# Patient Record
Sex: Female | Born: 1980 | Race: White | Hispanic: No | Marital: Married | Smoking: Never smoker
Health system: Southern US, Community
[De-identification: ages and names within clinical notes are randomized; demographics above are authoritative.]

## PROBLEM LIST (undated history)

## (undated) ENCOUNTER — Inpatient Hospital Stay (HOSPITAL_COMMUNITY): Payer: Self-pay

## (undated) DIAGNOSIS — T8859XA Other complications of anesthesia, initial encounter: Secondary | ICD-10-CM

## (undated) DIAGNOSIS — Z8619 Personal history of other infectious and parasitic diseases: Secondary | ICD-10-CM

## (undated) DIAGNOSIS — T4145XA Adverse effect of unspecified anesthetic, initial encounter: Secondary | ICD-10-CM

## (undated) HISTORY — PX: WISDOM TOOTH EXTRACTION: SHX21

## (undated) HISTORY — DX: Personal history of other infectious and parasitic diseases: Z86.19

---

## 2013-09-08 LAB — OB RESULTS CONSOLE HIV ANTIBODY (ROUTINE TESTING): HIV: NONREACTIVE

## 2013-09-08 LAB — OB RESULTS CONSOLE ANTIBODY SCREEN: ANTIBODY SCREEN: NEGATIVE

## 2013-09-08 LAB — OB RESULTS CONSOLE ABO/RH: RH TYPE: NEGATIVE

## 2013-09-08 LAB — OB RESULTS CONSOLE RUBELLA ANTIBODY, IGM: Rubella: IMMUNE

## 2013-09-08 LAB — OB RESULTS CONSOLE GC/CHLAMYDIA
Chlamydia: NEGATIVE
GC PROBE AMP, GENITAL: NEGATIVE

## 2013-09-08 LAB — OB RESULTS CONSOLE HEPATITIS B SURFACE ANTIGEN: Hepatitis B Surface Ag: NEGATIVE

## 2013-09-08 LAB — OB RESULTS CONSOLE RPR: RPR: NONREACTIVE

## 2014-02-20 ENCOUNTER — Encounter (HOSPITAL_COMMUNITY): Payer: Self-pay | Admitting: *Deleted

## 2014-02-20 ENCOUNTER — Inpatient Hospital Stay (HOSPITAL_COMMUNITY)
Admission: AD | Admit: 2014-02-20 | Discharge: 2014-02-21 | Disposition: A | Discharge status: Home / Self Care | Payer: BC Managed Care – PPO | Source: Ambulatory Visit | Attending: Obstetrics and Gynecology | Admitting: Obstetrics and Gynecology

## 2014-02-20 DIAGNOSIS — O99891 Other specified diseases and conditions complicating pregnancy: Secondary | ICD-10-CM | POA: Insufficient documentation

## 2014-02-20 DIAGNOSIS — O9989 Other specified diseases and conditions complicating pregnancy, childbirth and the puerperium: Principal | ICD-10-CM

## 2014-02-20 DIAGNOSIS — W19XXXA Unspecified fall, initial encounter: Secondary | ICD-10-CM | POA: Insufficient documentation

## 2014-02-20 NOTE — MAU Provider Note (Signed)
Chief Complaint:  Fall   None     HPI: Renee Hudson is a 33 y.o. G1P0 at [redacted]w[redacted]d who presents about 1 hr after falling forward landing on her abdomen.  Denies contractions, leakage of fluid or vaginal bleeding. Good fetal movement.   Pregnancy Course: Rh neg, had Rhophylactic; 1 hr GCT149, 3 hr nl per pt.   Past Medical History: History reviewed. No pertinent past medical history.  Past obstetric history: OB History  Gravida Para Term Preterm AB SAB TAB Ectopic Multiple Living  1             # Outcome Date GA Lbr Len/2nd Weight Sex Delivery Anes PTL Lv  1 CUR               Past Surgical History: Past Surgical History  Procedure Laterality Date  . Wisdom tooth extraction       Family History: Family History  Problem Relation Age of Onset  . Hypertension Mother   . Hypertension Father   . Hypertension Maternal Grandmother   . Hypertension Maternal Grandfather   . Cancer Maternal Grandfather     Lung  . Hypertension Paternal Grandmother   . Hypertension Paternal Grandfather     Social History: History  Substance Use Topics  . Smoking status: Never Smoker   . Smokeless tobacco: Not on file  . Alcohol Use: Not on file    Allergies: No Known Allergies  Meds:  Prescriptions prior to admission  Medication Sig Dispense Refill  . acetaminophen (TYLENOL) 500 MG tablet Take 1,000 mg by mouth every 6 (six) hours as needed.      . loratadine-pseudoephedrine (CLARITIN-D 24-HOUR) 10-240 MG per 24 hr tablet Take 1 tablet by mouth daily.      . Pantoprazole Sodium (PROTONIX PO) Take by mouth.      . Prenatal Vit-Fe Fumarate-FA (MULTIVITAMIN-PRENATAL) 27-0.8 MG TABS tablet Take 1 tablet by mouth daily at 12 noon.        ROS: Pertinent findings in history of present illness.  Physical Exam  Blood pressure 128/68, pulse 79, temperature 98.6 F (37 C), temperature source Oral, resp. rate 18, height  (1.549 m), weight 83.915 kg (185 lb), SpO2 99.00%. GENERAL:  Well-developed, well-nourished female in no acute distress.  HEENT: normocephalic HEART: normal rate RESP: normal effort ABDOMEN: Soft, non-tender, gravid appropriate for gestational age EXTREMITIES: Nontender, no edema NEURO: alert and oriented Pelvic: NEFG, physiologic discharge, no blood    FHT:  Baseline 135 , moderate variability, accelerations present, no decelerations Contractions: none   Labs: No results found for this or any previous visit (from the past 24 hour(s)).  Imaging:  No results found. MAU Course: Will monitor for 4 hrs then discharge home  Assessment: No diagnosis found.  Plan: Discharge home after monitoring Labor precautions and fetal kick counts    Medication List    ASK your doctor about these medications       acetaminophen 500 MG tablet  Commonly known as:  TYLENOL  Take 1,000 mg by mouth every 6 (six) hours as needed.     loratadine-pseudoephedrine 10-240 MG per 24 hr tablet  Commonly known as:  CLARITIN-D 24-hour  Take 1 tablet by mouth daily.     multivitamin-prenatal 27-0.8 MG Tabs tablet  Take 1 tablet by mouth daily at 12 noon.     PROTONIX PO  Take by mouth.        Aviva Signs, CNM 02/20/2014 11:59 PM

## 2014-02-20 NOTE — MAU Provider Note (Signed)
Chief Complaint:  Fall  Pt seen by Nelva Nay, CNM

## 2014-02-20 NOTE — MAU Note (Signed)
Pt reports fall about one hour ago, walking and fell forward on her stomach. Denies bleeding, positive fetal movement since fall.

## 2014-02-21 DIAGNOSIS — O2690 Pregnancy related conditions, unspecified, unspecified trimester: Secondary | ICD-10-CM

## 2014-02-21 DIAGNOSIS — W19XXXA Unspecified fall, initial encounter: Secondary | ICD-10-CM

## 2014-02-21 DIAGNOSIS — O99891 Other specified diseases and conditions complicating pregnancy: Secondary | ICD-10-CM | POA: Diagnosis present

## 2014-02-21 DIAGNOSIS — S3981XA Other specified injuries of abdomen, initial encounter: Secondary | ICD-10-CM

## 2014-02-21 NOTE — Discharge Instructions (Signed)

## 2014-03-20 LAB — OB RESULTS CONSOLE GBS: STREP GROUP B AG: POSITIVE

## 2014-04-13 ENCOUNTER — Encounter (HOSPITAL_COMMUNITY): Payer: Self-pay | Admitting: *Deleted

## 2014-04-13 ENCOUNTER — Telehealth (HOSPITAL_COMMUNITY): Payer: Self-pay | Admitting: *Deleted

## 2014-04-13 NOTE — Telephone Encounter (Signed)
Preadmission screen  

## 2014-04-20 ENCOUNTER — Inpatient Hospital Stay (HOSPITAL_COMMUNITY)
Admission: RE | Admit: 2014-04-20 | Discharge: 2014-04-20 | Disposition: A | Discharge status: Home / Self Care | Payer: BC Managed Care – PPO | Source: Ambulatory Visit | Attending: Obstetrics & Gynecology | Admitting: Obstetrics & Gynecology

## 2014-04-20 ENCOUNTER — Encounter (HOSPITAL_COMMUNITY): Payer: Self-pay | Admitting: *Deleted

## 2014-04-20 ENCOUNTER — Inpatient Hospital Stay (HOSPITAL_COMMUNITY)
Admission: AD | Admit: 2014-04-20 | Discharge: 2014-04-24 | DRG: 765 | Disposition: A | Discharge status: Home / Self Care | Payer: BC Managed Care – PPO | Source: Ambulatory Visit | Attending: Obstetrics & Gynecology | Admitting: Obstetrics & Gynecology

## 2014-04-20 DIAGNOSIS — K219 Gastro-esophageal reflux disease without esophagitis: Secondary | ICD-10-CM | POA: Diagnosis present

## 2014-04-20 DIAGNOSIS — Z8249 Family history of ischemic heart disease and other diseases of the circulatory system: Secondary | ICD-10-CM

## 2014-04-20 DIAGNOSIS — Z833 Family history of diabetes mellitus: Secondary | ICD-10-CM | POA: Diagnosis not present

## 2014-04-20 DIAGNOSIS — O360930 Maternal care for other rhesus isoimmunization, third trimester, not applicable or unspecified: Secondary | ICD-10-CM | POA: Diagnosis present

## 2014-04-20 DIAGNOSIS — Z3A41 41 weeks gestation of pregnancy: Secondary | ICD-10-CM | POA: Diagnosis present

## 2014-04-20 DIAGNOSIS — O9912 Other diseases of the blood and blood-forming organs and certain disorders involving the immune mechanism complicating childbirth: Secondary | ICD-10-CM | POA: Diagnosis present

## 2014-04-20 DIAGNOSIS — Z3490 Encounter for supervision of normal pregnancy, unspecified, unspecified trimester: Secondary | ICD-10-CM

## 2014-04-20 DIAGNOSIS — O99824 Streptococcus B carrier state complicating childbirth: Secondary | ICD-10-CM | POA: Diagnosis present

## 2014-04-20 DIAGNOSIS — D696 Thrombocytopenia, unspecified: Secondary | ICD-10-CM | POA: Diagnosis present

## 2014-04-20 DIAGNOSIS — O480 Post-term pregnancy: Principal | ICD-10-CM | POA: Diagnosis present

## 2014-04-20 DIAGNOSIS — Z98891 History of uterine scar from previous surgery: Secondary | ICD-10-CM

## 2014-04-20 DIAGNOSIS — O99613 Diseases of the digestive system complicating pregnancy, third trimester: Secondary | ICD-10-CM | POA: Diagnosis present

## 2014-04-20 LAB — CBC
HCT: 34.8 % — ABNORMAL LOW (ref 36.0–46.0)
HEMOGLOBIN: 12.3 g/dL (ref 12.0–15.0)
MCH: 32.2 pg (ref 26.0–34.0)
MCHC: 35.3 g/dL (ref 30.0–36.0)
MCV: 91.1 fL (ref 78.0–100.0)
Platelets: 142 10*3/uL — ABNORMAL LOW (ref 150–400)
RBC: 3.82 MIL/uL — AB (ref 3.87–5.11)
RDW: 13.2 % (ref 11.5–15.5)
WBC: 11.6 10*3/uL — AB (ref 4.0–10.5)

## 2014-04-20 LAB — TYPE AND SCREEN
ABO/RH(D): B NEG
ANTIBODY SCREEN: NEGATIVE

## 2014-04-20 MED ORDER — LIDOCAINE HCL (PF) 1 % IJ SOLN: 30.0000 mL | INTRAMUSCULAR | Status: DC | PRN

## 2014-04-20 MED ORDER — OXYCODONE-ACETAMINOPHEN 5-325 MG PO TABS: 2.0000 | ORAL_TABLET | ORAL | Status: DC | PRN

## 2014-04-20 MED ORDER — CITRIC ACID-SODIUM CITRATE 334-500 MG/5ML PO SOLN
30.0000 mL | ORAL | Status: DC | PRN
  Administered 2014-04-22: 30 mL via ORAL
  Filled 2014-04-20: qty 15

## 2014-04-20 MED ORDER — OXYTOCIN 40 UNITS IN LACTATED RINGERS INFUSION - SIMPLE MED: 62.5000 mL/h | INTRAVENOUS | Status: DC

## 2014-04-20 MED ORDER — LACTATED RINGERS IV SOLN: 500.0000 mL | INTRAVENOUS | Status: DC | PRN

## 2014-04-20 MED ORDER — ZOLPIDEM TARTRATE 5 MG PO TABS
5.0000 mg | ORAL_TABLET | Freq: Every evening | ORAL | Status: DC | PRN
  Administered 2014-04-20: 5 mg via ORAL
  Filled 2014-04-20: qty 1

## 2014-04-20 MED ORDER — FLEET ENEMA 7-19 GM/118ML RE ENEM: 1.0000 | ENEMA | RECTAL | Status: DC | PRN

## 2014-04-20 MED ORDER — TERBUTALINE SULFATE 1 MG/ML IJ SOLN: 0.2500 mg | Freq: Once | INTRAMUSCULAR | Status: AC | PRN

## 2014-04-20 MED ORDER — BUTORPHANOL TARTRATE 1 MG/ML IJ SOLN
1.0000 mg | INTRAMUSCULAR | Status: DC | PRN
  Administered 2014-04-21: 1 mg via INTRAVENOUS
  Filled 2014-04-20: qty 1

## 2014-04-20 MED ORDER — ACETAMINOPHEN 325 MG PO TABS: 650.0000 mg | ORAL_TABLET | ORAL | Status: DC | PRN

## 2014-04-20 MED ORDER — LACTATED RINGERS IV SOLN
INTRAVENOUS | Status: DC
  Administered 2014-04-20 – 2014-04-21 (×2): via INTRAVENOUS

## 2014-04-20 MED ORDER — OXYTOCIN BOLUS FROM INFUSION: 500.0000 mL | INTRAVENOUS | Status: DC

## 2014-04-20 MED ORDER — MISOPROSTOL 25 MCG QUARTER TABLET
25.0000 ug | ORAL_TABLET | ORAL | Status: DC | PRN
  Administered 2014-04-20 – 2014-04-21 (×2): 25 ug via VAGINAL
  Filled 2014-04-20 (×2): qty 0.25

## 2014-04-20 MED ORDER — PENICILLIN G POTASSIUM 5000000 UNITS IJ SOLR
5.0000 10*6.[IU] | Freq: Once | INTRAVENOUS | Status: DC
  Filled 2014-04-20: qty 5

## 2014-04-20 MED ORDER — PENICILLIN G POTASSIUM 5000000 UNITS IJ SOLR
2.5000 10*6.[IU] | INTRAVENOUS | Status: DC
  Filled 2014-04-20 (×5): qty 2.5

## 2014-04-20 MED ORDER — ONDANSETRON HCL 4 MG/2ML IJ SOLN
4.0000 mg | Freq: Four times a day (QID) | INTRAMUSCULAR | Status: DC | PRN
  Administered 2014-04-21: 4 mg via INTRAVENOUS
  Filled 2014-04-20: qty 2

## 2014-04-20 MED ORDER — OXYCODONE-ACETAMINOPHEN 5-325 MG PO TABS: 1.0000 | ORAL_TABLET | ORAL | Status: DC | PRN

## 2014-04-21 ENCOUNTER — Encounter (HOSPITAL_COMMUNITY): Payer: BC Managed Care – PPO | Admitting: Anesthesiology

## 2014-04-21 ENCOUNTER — Encounter (HOSPITAL_COMMUNITY): Payer: Self-pay | Admitting: Anesthesiology

## 2014-04-21 ENCOUNTER — Inpatient Hospital Stay (HOSPITAL_COMMUNITY): Payer: BC Managed Care – PPO | Admitting: Anesthesiology

## 2014-04-21 LAB — ABO/RH: ABO/RH(D): B NEG

## 2014-04-21 LAB — RPR

## 2014-04-21 MED ORDER — OXYTOCIN 40 UNITS IN LACTATED RINGERS INFUSION - SIMPLE MED
1.0000 m[IU]/min | INTRAVENOUS | Status: DC
  Administered 2014-04-21: 2 m[IU]/min via INTRAVENOUS
  Filled 2014-04-21: qty 1000

## 2014-04-21 MED ORDER — KETOROLAC TROMETHAMINE 30 MG/ML IJ SOLN: 30.0000 mg | Freq: Four times a day (QID) | INTRAMUSCULAR | Status: DC | PRN

## 2014-04-21 MED ORDER — EPHEDRINE 5 MG/ML INJ
10.0000 mg | INTRAVENOUS | Status: DC | PRN
  Filled 2014-04-21: qty 2

## 2014-04-21 MED ORDER — PHENYLEPHRINE 40 MCG/ML (10ML) SYRINGE FOR IV PUSH (FOR BLOOD PRESSURE SUPPORT)
80.0000 ug | PREFILLED_SYRINGE | INTRAVENOUS | Status: DC | PRN
  Filled 2014-04-21: qty 2

## 2014-04-21 MED ORDER — FENTANYL 2.5 MCG/ML BUPIVACAINE 1/10 % EPIDURAL INFUSION (WH - ANES)
INTRAMUSCULAR | Status: DC | PRN
  Administered 2014-04-21: 12.5 mL/h via EPIDURAL

## 2014-04-21 MED ORDER — CEFAZOLIN SODIUM-DEXTROSE 2-3 GM-% IV SOLR
2.0000 g | Freq: Three times a day (TID) | INTRAVENOUS | Status: DC
  Administered 2014-04-22 (×2): 2 g via INTRAVENOUS
  Filled 2014-04-21 (×3): qty 50

## 2014-04-21 MED ORDER — TERBUTALINE SULFATE 1 MG/ML IJ SOLN: 0.2500 mg | Freq: Once | INTRAMUSCULAR | Status: AC | PRN

## 2014-04-21 MED ORDER — PENICILLIN G POTASSIUM 5000000 UNITS IJ SOLR
2.5000 10*6.[IU] | INTRAVENOUS | Status: DC
  Administered 2014-04-21 (×3): 2.5 10*6.[IU] via INTRAVENOUS
  Filled 2014-04-21 (×9): qty 2.5

## 2014-04-21 MED ORDER — DIPHENHYDRAMINE HCL 50 MG/ML IJ SOLN: 12.5000 mg | INTRAMUSCULAR | Status: DC | PRN

## 2014-04-21 MED ORDER — FENTANYL 2.5 MCG/ML BUPIVACAINE 1/10 % EPIDURAL INFUSION (WH - ANES)
14.0000 mL/h | INTRAMUSCULAR | Status: DC | PRN
  Administered 2014-04-21 (×2): 14 mL/h via EPIDURAL
  Filled 2014-04-21 (×2): qty 125

## 2014-04-21 MED ORDER — LIDOCAINE HCL (PF) 1 % IJ SOLN
INTRAMUSCULAR | Status: DC | PRN
  Administered 2014-04-21: 3 mL
  Administered 2014-04-21: 4 mL

## 2014-04-21 MED ORDER — DEXTROSE 5 % IV SOLN
5.0000 10*6.[IU] | Freq: Once | INTRAVENOUS | Status: AC
  Administered 2014-04-21: 5 10*6.[IU] via INTRAVENOUS
  Filled 2014-04-21: qty 5

## 2014-04-21 MED ORDER — KETOROLAC TROMETHAMINE 30 MG/ML IJ SOLN
30.0000 mg | Freq: Four times a day (QID) | INTRAMUSCULAR | Status: DC | PRN
  Administered 2014-04-22: 30 mg via INTRAVENOUS

## 2014-04-21 MED ORDER — PHENYLEPHRINE 40 MCG/ML (10ML) SYRINGE FOR IV PUSH (FOR BLOOD PRESSURE SUPPORT)
80.0000 ug | PREFILLED_SYRINGE | INTRAVENOUS | Status: DC | PRN
  Filled 2014-04-21 (×2): qty 10
  Filled 2014-04-21: qty 2

## 2014-04-21 MED ORDER — LACTATED RINGERS IV SOLN
500.0000 mL | Freq: Once | INTRAVENOUS | Status: AC
  Administered 2014-04-21: 500 mL via INTRAVENOUS

## 2014-04-21 NOTE — Anesthesia Procedure Notes (Signed)
Epidural Patient location during procedure: OB Start time: 04/21/2014 11:41 AM  Staffing Anesthesiologist: Ionna Avis A. Performed by: anesthesiologist   Preanesthetic Checklist Completed: patient identified, site marked, surgical consent, pre-op evaluation, timeout performed, IV checked, risks and benefits discussed and monitors and equipment checked  Epidural Patient position: sitting Prep: site prepped and draped and DuraPrep Patient monitoring: continuous pulse ox and blood pressure Approach: midline Location: L3-L4 Injection technique: LOR air  Needle:  Needle type: Tuohy  Needle gauge: 17 G Needle length: 9 cm and 9 Needle insertion depth: 5 cm cm Catheter type: closed end flexible Catheter size: 19 Gauge Catheter at skin depth: 10 cm Test dose: negative and Other  Assessment Events: blood not aspirated, injection not painful, no injection resistance, negative IV test and no paresthesia  Additional Notes Patient identified. Risks and benefits discussed including failed block, incomplete  Pain control, post dural puncture headache, nerve damage, paralysis, blood pressure Changes, nausea, vomiting, reactions to medications-both toxic and allergic and post Partum back pain. All questions were answered. Patient expressed understanding and wished to proceed. Sterile technique was used throughout procedure. Epidural site was Dressed with sterile barrier dressing. No paresthesias, signs of intravascular injection Or signs of intrathecal spread were encountered.  Patient was more comfortable after the epidural was dosed. Please see RN's note for documentation of vital signs and FHR which are stable.

## 2014-04-21 NOTE — H&P (Signed)
Renee Hudson is a 33 y.o. female presenting for postdates induction.  Antepartum course complicated by Rh negative and GBS positive.  She received 2 doses of cytotec overnight and reports mild CTX.     Maternal Medical History:  Fetal activity: Perceived fetal activity is normal.   Last perceived fetal movement was within the past hour.    Prenatal complications: no prenatal complications Prenatal Complications - Diabetes: none.    OB History   Grav Para Term Preterm Abortions TAB SAB Ect Mult Living   1              Past Medical History  Diagnosis Date  . Hx of varicella    Past Surgical History  Procedure Laterality Date  . Wisdom tooth extraction     Family History: family history includes Cancer in her maternal grandfather; Diabetes in her paternal uncle; Hypertension in her father, maternal grandfather, maternal grandmother, mother, paternal grandfather, and paternal grandmother; Multiple sclerosis in her maternal grandfather; Thyroid disease in her mother. Social History:  reports that she has never smoked. She does not have any smokeless tobacco history on file. She reports that she does not drink alcohol or use illicit drugs.   Prenatal Transfer Tool  Maternal Diabetes: No Genetic Screening: Normal Maternal Ultrasounds/Referrals: Normal Fetal Ultrasounds or other Referrals:  None Maternal Substance Abuse:  No Significant Maternal Medications:  None Significant Maternal Lab Results:  Lab values include: Group B Strep positive Other Comments:  None  ROS  Dilation: 3 Effacement (%): 50 Station: -2 Exam by:: Dr Langston MaskerMorris Blood pressure 125/73, pulse 66, temperature 98.7 F (37.1 C), temperature source Oral, resp. rate 20, height 5\' 1"  (1.549 m), weight 190 lb (86.183 kg). Maternal Exam:  Uterine Assessment: Contraction strength is mild.  Contraction frequency is irregular.   Abdomen: Patient reports no abdominal tenderness. Fundal height is c/w dates.   Estimated  fetal weight is 8#.   Fetal presentation: vertex  Introitus: Normal vulva. Pelvis: adequate for delivery.   Cervix: Cervix evaluated by digital exam.     Physical Exam  Constitutional: She is oriented to person, place, and time. She appears well-developed and well-nourished.  GI: Soft. There is no rebound and no guarding.  Neurological: She is alert and oriented to person, place, and time.  Skin: Skin is warm and dry.  Psychiatric: She has a normal mood and affect. Her behavior is normal.    Prenatal labs: ABO, Rh: --/--/B NEG, B NEG (10/22 2115) Antibody: NEG (10/22 2115) Rubella: Immune (03/12 0000) RPR: NON REAC (10/22 2115)  HBsAg: Negative (03/12 0000)  HIV: Non-reactive (03/12 0000)  GBS: Positive (09/21 0000)   Assessment/Plan: 33yo G1 at 7576w0d with PDI -s/p AROM -Will start pitocin -Epidural when desired   Jacquese Cassarino 04/21/2014, 7:57 AM

## 2014-04-21 NOTE — Plan of Care (Signed)
Problem: Consults Goal: Birthing Suites Patient Information Press F2 to bring up selections list Outcome: Completed/Met Date Met:  04/21/14  Pt > [redacted] weeks EGA and Inpatient induction

## 2014-04-21 NOTE — Anesthesia Preprocedure Evaluation (Signed)
Anesthesia Evaluation  Patient identified by MRN, date of birth, ID band Patient awake    Reviewed: Allergy & Precautions, H&P , Patient's Chart, lab work & pertinent test results  Airway Mallampati: III TM Distance: >3 FB Neck ROM: Full    Dental no notable dental hx. (+) Teeth Intact   Pulmonary neg pulmonary ROS,  breath sounds clear to auscultation  Pulmonary exam normal       Cardiovascular negative cardio ROS  Rhythm:Regular Rate:Normal     Neuro/Psych negative neurological ROS  negative psych ROS   GI/Hepatic Neg liver ROS, GERD-  Medicated and Controlled,  Endo/Other  Obesity  Renal/GU negative Renal ROS  negative genitourinary   Musculoskeletal negative musculoskeletal ROS (+)   Abdominal (+) + obese,   Peds  Hematology  (+) anemia , Thrombocytopenia-mild    Anesthesia Other Findings   Reproductive/Obstetrics (+) Pregnancy                           Anesthesia Physical Anesthesia Plan  ASA: II  Anesthesia Plan: Epidural   Post-op Pain Management:    Induction:   Airway Management Planned: Natural Airway  Additional Equipment:   Intra-op Plan:   Post-operative Plan:   Informed Consent: I have reviewed the patients History and Physical, chart, labs and discussed the procedure including the risks, benefits and alternatives for the proposed anesthesia with the patient or authorized representative who has indicated his/her understanding and acceptance.     Plan Discussed with: Anesthesiologist  Anesthesia Plan Comments:         Anesthesia Quick Evaluation

## 2014-04-21 NOTE — Progress Notes (Signed)
Patient has no cervical change (9/c/-1) x 4 hours despite repositioning and pitocin.  Cat I FHT.  Patient counseled re: recommendation for C/S secondary to arrest of descent.  She is informed of the risk of bleeding, infection, scarring and damage to surrounding structures.  She understands the effects in future pregnancies.  All questions were answered and the patient wishes to proceed.    Mitchel HonourMegan Tzipora Mcinroy, DO

## 2014-04-22 ENCOUNTER — Encounter (HOSPITAL_COMMUNITY)
Admission: AD | Disposition: A | Discharge status: Home / Self Care | Payer: Self-pay | Source: Ambulatory Visit | Attending: Obstetrics & Gynecology

## 2014-04-22 ENCOUNTER — Encounter (HOSPITAL_COMMUNITY): Payer: Self-pay | Admitting: Anesthesiology

## 2014-04-22 DIAGNOSIS — Z98891 History of uterine scar from previous surgery: Secondary | ICD-10-CM

## 2014-04-22 LAB — CBC
HEMATOCRIT: 30.4 % — AB (ref 36.0–46.0)
Hemoglobin: 10.6 g/dL — ABNORMAL LOW (ref 12.0–15.0)
MCH: 31.9 pg (ref 26.0–34.0)
MCHC: 34.9 g/dL (ref 30.0–36.0)
MCV: 91.6 fL (ref 78.0–100.0)
PLATELETS: 119 10*3/uL — AB (ref 150–400)
RBC: 3.32 MIL/uL — ABNORMAL LOW (ref 3.87–5.11)
RDW: 13.1 % (ref 11.5–15.5)
WBC: 21.3 10*3/uL — AB (ref 4.0–10.5)

## 2014-04-22 SURGERY — Surgical Case
Anesthesia: Epidural | Site: Abdomen

## 2014-04-22 MED ORDER — RHO D IMMUNE GLOBULIN 1500 UNIT/2ML IJ SOSY
300.0000 ug | PREFILLED_SYRINGE | Freq: Once | INTRAMUSCULAR | Status: AC
  Administered 2014-04-22: 300 ug via INTRAVENOUS
  Filled 2014-04-22: qty 2

## 2014-04-22 MED ORDER — ONDANSETRON HCL 4 MG/2ML IJ SOLN
INTRAMUSCULAR | Status: DC | PRN
  Administered 2014-04-22: 4 mg via INTRAVENOUS

## 2014-04-22 MED ORDER — OXYTOCIN 40 UNITS IN LACTATED RINGERS INFUSION - SIMPLE MED: 62.5000 mL/h | INTRAVENOUS | Status: AC

## 2014-04-22 MED ORDER — MEPERIDINE HCL 25 MG/ML IJ SOLN: 6.2500 mg | INTRAMUSCULAR | Status: DC | PRN

## 2014-04-22 MED ORDER — WITCH HAZEL-GLYCERIN EX PADS: 1.0000 "application " | MEDICATED_PAD | CUTANEOUS | Status: DC | PRN

## 2014-04-22 MED ORDER — MEPERIDINE HCL 25 MG/ML IJ SOLN
INTRAMUSCULAR | Status: AC
  Filled 2014-04-22: qty 1

## 2014-04-22 MED ORDER — MENTHOL 3 MG MT LOZG: 1.0000 | LOZENGE | OROMUCOSAL | Status: DC | PRN

## 2014-04-22 MED ORDER — SODIUM CHLORIDE 0.9 % IJ SOLN: 3.0000 mL | INTRAMUSCULAR | Status: DC | PRN

## 2014-04-22 MED ORDER — OXYTOCIN 10 UNIT/ML IJ SOLN
INTRAMUSCULAR | Status: AC
  Filled 2014-04-22: qty 4

## 2014-04-22 MED ORDER — LACTATED RINGERS IV SOLN
INTRAVENOUS | Status: DC
  Administered 2014-04-22: 1 mL via INTRAVENOUS
  Administered 2014-04-22: 14:00:00 via INTRAVENOUS

## 2014-04-22 MED ORDER — OXYCODONE-ACETAMINOPHEN 5-325 MG PO TABS
1.0000 | ORAL_TABLET | ORAL | Status: DC | PRN
  Administered 2014-04-23 – 2014-04-24 (×6): 1 via ORAL
  Filled 2014-04-22 (×6): qty 1

## 2014-04-22 MED ORDER — NALBUPHINE HCL 10 MG/ML IJ SOLN
5.0000 mg | INTRAMUSCULAR | Status: DC | PRN
  Administered 2014-04-22: 5 mg via INTRAVENOUS
  Filled 2014-04-22: qty 1

## 2014-04-22 MED ORDER — FENTANYL CITRATE 0.05 MG/ML IJ SOLN
INTRAMUSCULAR | Status: DC | PRN
  Administered 2014-04-22: 100 ug via INTRAVENOUS

## 2014-04-22 MED ORDER — KETOROLAC TROMETHAMINE 30 MG/ML IJ SOLN
INTRAMUSCULAR | Status: AC
  Filled 2014-04-22: qty 1

## 2014-04-22 MED ORDER — LANOLIN HYDROUS EX OINT: 1.0000 "application " | TOPICAL_OINTMENT | CUTANEOUS | Status: DC | PRN

## 2014-04-22 MED ORDER — DEXAMETHASONE SODIUM PHOSPHATE 4 MG/ML IJ SOLN
INTRAMUSCULAR | Status: DC | PRN
Start: 2014-04-22 — End: 2014-04-22
  Administered 2014-04-22: 4 mg via INTRAVENOUS

## 2014-04-22 MED ORDER — LACTATED RINGERS IV SOLN
INTRAVENOUS | Status: DC | PRN
  Administered 2014-04-22: 01:00:00 via INTRAVENOUS

## 2014-04-22 MED ORDER — PRENATAL MULTIVITAMIN CH
1.0000 | ORAL_TABLET | Freq: Every day | ORAL | Status: DC
  Administered 2014-04-22 – 2014-04-23 (×2): 1 via ORAL
  Filled 2014-04-22 (×2): qty 1

## 2014-04-22 MED ORDER — NALOXONE HCL 0.4 MG/ML IJ SOLN: 0.4000 mg | INTRAMUSCULAR | Status: DC | PRN

## 2014-04-22 MED ORDER — NALBUPHINE HCL 10 MG/ML IJ SOLN: 5.0000 mg | Freq: Once | INTRAMUSCULAR | Status: AC | PRN

## 2014-04-22 MED ORDER — ONDANSETRON HCL 4 MG/2ML IJ SOLN: 4.0000 mg | INTRAMUSCULAR | Status: DC | PRN

## 2014-04-22 MED ORDER — SIMETHICONE 80 MG PO CHEW
80.0000 mg | CHEWABLE_TABLET | ORAL | Status: DC
  Administered 2014-04-22 – 2014-04-23 (×2): 80 mg via ORAL
  Filled 2014-04-22 (×2): qty 1

## 2014-04-22 MED ORDER — TETANUS-DIPHTH-ACELL PERTUSSIS 5-2.5-18.5 LF-MCG/0.5 IM SUSP: 0.5000 mL | Freq: Once | INTRAMUSCULAR | Status: DC

## 2014-04-22 MED ORDER — MORPHINE SULFATE (PF) 0.5 MG/ML IJ SOLN
INTRAMUSCULAR | Status: DC | PRN
  Administered 2014-04-22: 4 mg via EPIDURAL
  Administered 2014-04-22: 1 mg via INTRAVENOUS

## 2014-04-22 MED ORDER — OXYTOCIN 10 UNIT/ML IJ SOLN
40.0000 [IU] | INTRAVENOUS | Status: DC | PRN
  Administered 2014-04-22: 40 [IU] via INTRAVENOUS

## 2014-04-22 MED ORDER — MORPHINE SULFATE 0.5 MG/ML IJ SOLN
INTRAMUSCULAR | Status: AC
  Filled 2014-04-22: qty 10

## 2014-04-22 MED ORDER — LACTATED RINGERS IV SOLN
INTRAVENOUS | Status: DC | PRN
  Administered 2014-04-22 (×2): via INTRAVENOUS

## 2014-04-22 MED ORDER — NALOXONE HCL 1 MG/ML IJ SOLN
1.0000 ug/kg/h | INTRAVENOUS | Status: DC | PRN
  Filled 2014-04-22: qty 2

## 2014-04-22 MED ORDER — SODIUM BICARBONATE 8.4 % IV SOLN
INTRAVENOUS | Status: AC
  Filled 2014-04-22: qty 50

## 2014-04-22 MED ORDER — SENNOSIDES-DOCUSATE SODIUM 8.6-50 MG PO TABS
2.0000 | ORAL_TABLET | ORAL | Status: DC
  Administered 2014-04-22 – 2014-04-23 (×2): 2 via ORAL
  Filled 2014-04-22 (×2): qty 2

## 2014-04-22 MED ORDER — FENTANYL CITRATE 0.05 MG/ML IJ SOLN
INTRAMUSCULAR | Status: AC
  Filled 2014-04-22: qty 2

## 2014-04-22 MED ORDER — DIBUCAINE 1 % RE OINT: 1.0000 "application " | TOPICAL_OINTMENT | RECTAL | Status: DC | PRN

## 2014-04-22 MED ORDER — DEXAMETHASONE SODIUM PHOSPHATE 10 MG/ML IJ SOLN
INTRAMUSCULAR | Status: AC
  Filled 2014-04-22: qty 1

## 2014-04-22 MED ORDER — FENTANYL CITRATE 0.05 MG/ML IJ SOLN
25.0000 ug | INTRAMUSCULAR | Status: DC | PRN
  Administered 2014-04-22: 50 ug via INTRAVENOUS

## 2014-04-22 MED ORDER — DIPHENHYDRAMINE HCL 25 MG PO CAPS: 25.0000 mg | ORAL_CAPSULE | ORAL | Status: DC | PRN

## 2014-04-22 MED ORDER — SCOPOLAMINE 1 MG/3DAYS TD PT72SCOPOLAMINE 1 MG/3DAYS
MEDICATED_PATCH | TRANSDERMAL | Status: DC | PRN
Start: 2014-04-22 — End: 2014-04-22
  Administered 2014-04-22: 1 via TRANSDERMAL

## 2014-04-22 MED ORDER — ZOLPIDEM TARTRATE 5 MG PO TABS: 5.0000 mg | ORAL_TABLET | Freq: Every evening | ORAL | Status: DC | PRN

## 2014-04-22 MED ORDER — SIMETHICONE 80 MG PO CHEW
80.0000 mg | CHEWABLE_TABLET | Freq: Three times a day (TID) | ORAL | Status: DC
  Administered 2014-04-22 – 2014-04-24 (×4): 80 mg via ORAL
  Filled 2014-04-22 (×3): qty 1

## 2014-04-22 MED ORDER — ONDANSETRON HCL 4 MG/2ML IJ SOLN
INTRAMUSCULAR | Status: AC
  Filled 2014-04-22: qty 2

## 2014-04-22 MED ORDER — SODIUM BICARBONATE 8.4 % IV SOLN
INTRAVENOUS | Status: DC | PRN
  Administered 2014-04-22: 5 mL via EPIDURAL

## 2014-04-22 MED ORDER — IBUPROFEN 600 MG PO TABS
600.0000 mg | ORAL_TABLET | Freq: Four times a day (QID) | ORAL | Status: DC
  Administered 2014-04-22 – 2014-04-24 (×9): 600 mg via ORAL
  Filled 2014-04-22 (×9): qty 1

## 2014-04-22 MED ORDER — LIDOCAINE-EPINEPHRINE (PF) 2 %-1:200000 IJ SOLN
INTRAMUSCULAR | Status: AC
Start: 2014-04-22 — End: 2014-04-22
  Filled 2014-04-22: qty 20

## 2014-04-22 MED ORDER — NALBUPHINE HCL 10 MG/ML IJ SOLN: 5.0000 mg | INTRAMUSCULAR | Status: DC | PRN

## 2014-04-22 MED ORDER — MEPERIDINE HCL 25 MG/ML IJ SOLN
INTRAMUSCULAR | Status: DC | PRN
  Administered 2014-04-22 (×2): 12.5 mg via INTRAVENOUS

## 2014-04-22 MED ORDER — SIMETHICONE 80 MG PO CHEW
80.0000 mg | CHEWABLE_TABLET | ORAL | Status: DC | PRN
  Administered 2014-04-23: 80 mg via ORAL
  Filled 2014-04-22: qty 1

## 2014-04-22 MED ORDER — DIPHENHYDRAMINE HCL 25 MG PO CAPS: 25.0000 mg | ORAL_CAPSULE | Freq: Four times a day (QID) | ORAL | Status: DC | PRN

## 2014-04-22 MED ORDER — ONDANSETRON HCL 4 MG/2ML IJ SOLN: 4.0000 mg | Freq: Three times a day (TID) | INTRAMUSCULAR | Status: DC | PRN

## 2014-04-22 MED ORDER — SCOPOLAMINE 1 MG/3DAYS TD PT72
1.0000 | MEDICATED_PATCH | Freq: Once | TRANSDERMAL | Status: DC
  Filled 2014-04-22: qty 1

## 2014-04-22 MED ORDER — DIPHENHYDRAMINE HCL 50 MG/ML IJ SOLN: 12.5000 mg | INTRAMUSCULAR | Status: DC | PRN

## 2014-04-22 MED ORDER — OXYCODONE-ACETAMINOPHEN 5-325 MG PO TABS
2.0000 | ORAL_TABLET | ORAL | Status: DC | PRN
Start: 2014-04-22 — End: 2014-04-24

## 2014-04-22 MED ORDER — ONDANSETRON HCL 4 MG PO TABS: 4.0000 mg | ORAL_TABLET | ORAL | Status: DC | PRN

## 2014-04-22 MED ORDER — SCOPOLAMINE 1 MG/3DAYS TD PT72
MEDICATED_PATCH | TRANSDERMAL | Status: AC
  Filled 2014-04-22: qty 1

## 2014-04-22 SURGICAL SUPPLY — 34 items
BENZOIN TINCTURE PRP APPL 2/3 (GAUZE/BANDAGES/DRESSINGS) ×3 IMPLANT
CLAMP CORD UMBIL (MISCELLANEOUS) IMPLANT
CLOSURE WOUND 1/2 X4 (GAUZE/BANDAGES/DRESSINGS) ×1
CLOTH BEACON ORANGE TIMEOUT ST (SAFETY) ×3 IMPLANT
DERMABOND ADVANCED (GAUZE/BANDAGES/DRESSINGS)
DERMABOND ADVANCED .7 DNX12 (GAUZE/BANDAGES/DRESSINGS) IMPLANT
DRAPE SHEET LG 3/4 BI-LAMINATE (DRAPES) IMPLANT
DRSG OPSITE POSTOP 4X10 (GAUZE/BANDAGES/DRESSINGS) ×3 IMPLANT
DURAPREP 26ML APPLICATOR (WOUND CARE) ×3 IMPLANT
ELECT REM PT RETURN 9FT ADLT (ELECTROSURGICAL) ×3
ELECTRODE REM PT RTRN 9FT ADLT (ELECTROSURGICAL) ×1 IMPLANT
EXTRACTOR VACUUM KIWI (MISCELLANEOUS) IMPLANT
EXTRACTOR VACUUM M CUP 4 TUBE (SUCTIONS) IMPLANT
EXTRACTOR VACUUM M CUP 4' TUBE (SUCTIONS)
GLOVE BIO SURGEON STRL SZ 6 (GLOVE) ×3 IMPLANT
GLOVE BIOGEL PI IND STRL 6 (GLOVE) ×2 IMPLANT
GLOVE BIOGEL PI INDICATOR 6 (GLOVE) ×4
GOWN STRL REUS W/TWL LRG LVL3 (GOWN DISPOSABLE) ×6 IMPLANT
KIT ABG SYR 3ML LUER SLIP (SYRINGE) ×3 IMPLANT
NEEDLE HYPO 25X5/8 SAFETYGLIDE (NEEDLE) ×3 IMPLANT
NS IRRIG 1000ML POUR BTL (IV SOLUTION) ×3 IMPLANT
PACK C SECTION WH (CUSTOM PROCEDURE TRAY) ×3 IMPLANT
PAD OB MATERNITY 4.3X12.25 (PERSONAL CARE ITEMS) ×3 IMPLANT
STAPLER VISISTAT 35W (STAPLE) IMPLANT
STRIP CLOSURE SKIN 1/2X4 (GAUZE/BANDAGES/DRESSINGS) ×2 IMPLANT
SUT CHROMIC 0 CTX 36 (SUTURE) ×9 IMPLANT
SUT MON AB 2-0 CT1 27 (SUTURE) ×3 IMPLANT
SUT PDS AB 0 CT1 27 (SUTURE) IMPLANT
SUT PLAIN 0 NONE (SUTURE) IMPLANT
SUT VIC AB 0 CT1 36 (SUTURE) ×3 IMPLANT
SUT VIC AB 4-0 KS 27 (SUTURE) ×3 IMPLANT
TOWEL OR 17X24 6PK STRL BLUE (TOWEL DISPOSABLE) ×3 IMPLANT
TRAY FOLEY CATH 14FR (SET/KITS/TRAYS/PACK) IMPLANT
WATER STERILE IRR 1000ML POUR (IV SOLUTION) ×3 IMPLANT

## 2014-04-22 NOTE — Lactation Note (Signed)
This note was copied from the chart of Renee Hudson. Lactation Consultation Note  Patient Name: Renee Mikki HarborLauren Viviani Today's Date: 04/22/2014 Reason for consult: Initial assessment of this mom and baby at 20 hours postpartum.  Mom is nursing using a NS and FOB reports that this is helping baby latch better.  [facility] Resource brochure to FOB while mom in bathroom and he will share information with her [facility].  Per RN, Molli KnockK. Brooks, baby is latching with NS.  RN, Maralyn SagoSarah had completed most of breastfeeding education early this am, including hand expression and cue feeding.  LC encouraged FOB to review page 14 in Baby and Me for information about STS benefits. LC encouraged review of Baby and Me pp 9, 14 and 20-25 for STS and BF information. LC provided [facility] Resource brochure and reviewed [facility] services and list of community and web site resources.     Maternal Data Formula Feeding for Exclusion: No Has patient been taught Hand Expression?: Yes (per RN, Sarah at [phone] feeding) Does the patient have breastfeeding experience prior to this delivery?: No  Feeding    LATCH Score/Interventions       Type of Nipple: Flat Intervention(s): Reverse pressure (nipple shield given by night RN; per FOB, this is helping baby with latching)              Lactation Tools Discussed/Used   STS, cue feedings, use of NS  Consult Status Consult Status: Follow-up Date: 04/23/14 Follow-up type: In-patient    Warrick ParisianBryant, Jeralynn Vaquera [facility] 04/22/2014, 9:21 PM

## 2014-04-22 NOTE — Anesthesia Postprocedure Evaluation (Signed)
  Anesthesia Post-op Note  Patient: Renee Hudson  Procedure(s) Performed: Procedure(s): CESAREAN SECTION (N/A)  Patient Location: PACU  Anesthesia Type:Epidural  Level of Consciousness: awake  Airway and Oxygen Therapy: Patient Spontanous Breathing  Post-op Pain: mild  Post-op Assessment: Post-op Vital signs reviewed, Patient's Cardiovascular Status Stable, Respiratory Function Stable, Patent Airway, No signs of Nausea or vomiting and Pain level controlled  Post-op Vital Signs: Reviewed and stable  Last Vitals:  Filed Vitals:   04/22/14 0540  BP: 108/60  Pulse: 78  Temp: 37.1 C  Resp: 20    Complications: No apparent anesthesia complications

## 2014-04-22 NOTE — Transfer of Care (Signed)
Immediate Anesthesia Transfer of Care Note  Patient: Mikki HarborLauren Leadbetter  Procedure(s) Performed: Procedure(s): CESAREAN SECTION (N/A)  Patient Location: PACU  Anesthesia Type:Epidural  Level of Consciousness: awake, alert , oriented and patient cooperative  Airway & Oxygen Therapy: Patient Spontanous Breathing  Post-op Assessment: Report given to PACU RN, Post -op Vital signs reviewed and stable and Patient moving all extremities X 4  Post vital signs: Reviewed and stable  Complications: No apparent anesthesia complications

## 2014-04-22 NOTE — Op Note (Signed)
Renee HarborLauren Hudson PROCEDURE DATE: 04/20/2014 - 04/22/2014  PREOPERATIVE DIAGNOSIS: Intrauterine pregnancy at  6960w1d weeks gestation with arrest of descent  POSTOPERATIVE DIAGNOSIS: The same  PROCEDURE:  Primary Low Transverse Cesarean Section  SURGEON:  Dr. Mitchel HonourMegan Larua Collier  INDICATIONS: Renee HarborLauren Hudson is a 33 y.o. G1P0 at 9060w1d scheduled for cesarean section secondary to arrest of descent.  The risks of cesarean section discussed with the patient included but were not limited to: bleeding which may require transfusion or reoperation; infection which may require antibiotics; injury to bowel, bladder, ureters or other surrounding organs; injury to the fetus; need for additional procedures including hysterectomy in the event of a life-threatening hemorrhage; placental abnormalities wth subsequent pregnancies, incisional problems, thromboembolic phenomenon and other postoperative/anesthesia complications. The patient concurred with the proposed plan, giving informed written consent for the procedure.    FINDINGS:  Viable female infant in cephalic presentation, APGARs 2,959,10:  Weight 8#7  Clear amniotic fluid.  Intact placenta, three vessel cord.  Grossly normal uterus, ovaries and fallopian tubes. .   ANESTHESIA:    Epidural ESTIMATED BLOOD LOSS: 800 ml SPECIMENS: Placenta sent to L&D COMPLICATIONS: None immediate  PROCEDURE IN DETAIL:  The patient received intravenous antibiotics and had sequential compression devices applied to her lower extremities while in the preoperative area.  She was then taken to the operating room where epidural anesthesia was dosed up to surgical level and was found to be adequate. She was then placed in a dorsal supine position with a leftward tilt, and prepped and draped in a sterile manner.  A foley catheter was placed into her bladder and attached to constant gravity.  After an adequate timeout was performed, a Pfannenstiel skin incision was made with scalpel and carried  through to the underlying layer of fascia. The fascia was incised in the midline and this incision was extended bilaterally using the Mayo scissors. Kocher clamps were applied to the superior aspect of the fascial incision and the underlying rectus muscles were dissected off bluntly. A similar process was carried out on the inferior aspect of the facial incision. The rectus muscles were separated in the midline bluntly and the peritoneum was entered bluntly.  A bladder flap was created sharply and developed bluntly; bladder blade was placed.  A transverse hysterotomy was made with a scalpel and extended bilaterally bluntly. The bladder blade was then removed. The infant was successfully delivered, and cord was clamped and cut and infant was handed over to awaiting neonatology team. Uterine massage was then administered and the placenta delivered intact with three-vessel cord. The uterus was cleared of clot and debris.  The hysterotomy was closed with 0 chromic.  A second imbricating suture of 0 chromic was used to reinforce the incision and aid in hemostasis.  The peritoneum and rectus muscles were noted to be hemostatic and were reapproximated using 3-0 monocryl in a running fashion.  The fascia was closed with 0-Vicryl in a running fashion with good restoration of anatomy.  The subcutaneus tissue was copiously irrigated.  The skin was closed with 4-0 vicryl in a subcuticular fashion.  Pt tolerated the procedure will.  All counts were correct x2.  Pt went to the recovery room in stable condition.

## 2014-04-22 NOTE — Progress Notes (Signed)
Subjective: Postpartum Day 0 Cesarean Delivery Patient reports tolerating PO.    Objective: Vital signs in last 24 hours: Temp:  [98.1 F (36.7 C)-99.6 F (37.6 C)] 98.3 F (36.8 C) (10/24 0658) Pulse Rate:  [58-99] 68 (10/24 0658) Resp:  [16-29] 20 (10/24 0658) BP: (88-125)/(37-86) 91/43 mmHg (10/24 0658) SpO2:  [85 %-98 %] 96 % (10/24 0900)  Physical Exam:  General: alert, cooperative and appears stated age 28Lochia: appropriate Uterine Fundus: firm Incision: healing well, no significant drainage, no dehiscence DVT Evaluation: No evidence of DVT seen on physical exam. Negative Homan's sign. No cords or calf tenderness.   Recent Labs  04/20/14 2115 04/22/14 0615  HGB 12.3 10.6*  HCT 34.8* 30.4*    Assessment/Plan: Status post Cesarean section. Doing well postoperatively.  Continue current care.  Fraidy Mccarrick 04/22/2014, 10:42 AM

## 2014-04-23 LAB — CBC
HCT: 31.1 % — ABNORMAL LOW (ref 36.0–46.0)
HEMOGLOBIN: 11.2 g/dL — AB (ref 12.0–15.0)
MCH: 33.1 pg (ref 26.0–34.0)
MCHC: 36 g/dL (ref 30.0–36.0)
MCV: 92 fL (ref 78.0–100.0)
PLATELETS: 127 10*3/uL — AB (ref 150–400)
RBC: 3.38 MIL/uL — AB (ref 3.87–5.11)
RDW: 13.3 % (ref 11.5–15.5)
WBC: 20.1 10*3/uL — AB (ref 4.0–10.5)

## 2014-04-23 LAB — RH IG WORKUP (INCLUDES ABO/RH)
ABO/RH(D): B NEG
FETAL SCREEN: NEGATIVE
Gestational Age(Wks): 41
UNIT DIVISION: 0

## 2014-04-23 NOTE — Lactation Note (Signed)
This note was copied from the chart of Renee Hudson. Lactation Consultation Note  Patient Name: Renee Hudson EATVV'L Date: 04/23/2014 Reason for consult: Follow-up assessment.  Baby is now 4 hours old and has been breastfeeding more consistently for 15-30 minutes today.  Mom states she is using the nipple shield and denies any nipple trauma but has noticed some "pinching" of nipple while nursing.  Baby has met output guidelines for this hour of life.  LC encouraged continued cue feedings and asked mom to page at next feeding for an assessment of latch.  No LATCH score has been assessed today and yesterday, LATCH scores were 4/5.     Maternal Data    Feeding Feeding Type: Breast Fed Length of feed: 30 min  LATCH Score/Interventions            See above for LATCH score information          Lactation Tools Discussed/Used   Cue feedings, hand expression to ensure wide latch (referred to Baby and Me, page 22)  Consult Status Consult Status: Follow-up Date: 04/24/14 Follow-up type: In-patient    Junious Dresser [facility] 04/23/2014, 5:03 PM

## 2014-04-23 NOTE — Addendum Note (Signed)
Addendum created 04/23/14 0905 by Shanon PayorSuzanne M Alter Moss, CRNA   Modules edited: Notes Section   Notes Section:  File: [id number]

## 2014-04-23 NOTE — Progress Notes (Signed)
Subjective: Postpartum Day 1: Cesarean Delivery Patient reports tolerating PO, + flatus and no problems voiding.    Objective: Vital signs in last 24 hours: Temp:  [97.5 F (36.4 C)-98.8 F (37.1 C)] 98 F (36.7 C) (10/25 0541) Pulse Rate:  [52-66] 58 (10/25 0541) Resp:  [18-20] 18 (10/25 0541) BP: (92-114)/(43-69) 111/65 mmHg (10/25 0541) SpO2:  [95 %-99 %] 99 % (10/24 2310)  Physical Exam:  General: alert, cooperative and appears stated age Lochia: appropriate Uterine Fundus: firm Incision: healing well, no significant drainage, no dehiscence DVT Evaluation: No evidence of DVT seen on physical exam. Negative Homan's sign. No cords or calf tenderness.   Recent Labs  04/22/14 0615 04/23/14 0547  HGB 10.6* 11.2*  HCT 30.4* 31.1*    Assessment/Plan: Status post Cesarean section. Doing well postoperatively.  Continue current care.  Deondra Wigger 04/23/2014, 1:39 PM

## 2014-04-23 NOTE — Anesthesia Postprocedure Evaluation (Signed)
  Anesthesia Post-op Note  Patient: Mikki HarborLauren Boehm  Procedure(s) Performed: Procedure(s): CESAREAN SECTION (N/A)  Patient Location: Mother/Baby  Anesthesia Type:Epidural  Level of Consciousness: awake, alert  and oriented  Airway and Oxygen Therapy: Patient Spontanous Breathing  Post-op Pain: none  Post-op Assessment: Post-op Vital signs reviewed, Patient's Cardiovascular Status Stable, Respiratory Function Stable, No headache, No backache, No residual numbness and No residual motor weakness  Post-op Vital Signs: Reviewed and stable  Last Vitals:  Filed Vitals:   04/23/14 0541  BP: 111/65  Pulse: 58  Temp: 36.7 C  Resp: 18    Complications: No apparent anesthesia complications

## 2014-04-24 ENCOUNTER — Encounter (HOSPITAL_COMMUNITY): Payer: Self-pay | Admitting: Obstetrics & Gynecology

## 2014-04-24 MED ORDER — IBUPROFEN 600 MG PO TABS: 600.0000 mg | ORAL_TABLET | Freq: Four times a day (QID) | ORAL | Status: DC

## 2014-04-24 MED ORDER — OXYCODONE-ACETAMINOPHEN 5-325 MG PO TABS: 1.0000 | ORAL_TABLET | ORAL | Status: DC | PRN

## 2014-04-24 NOTE — Lactation Note (Signed)
This note was copied from the chart of Renee Hudson. Lactation Consultation Note Noted wt. Loss of 9%. Had 3 voids, 8 poops, and 5 spit ups. Has been feeding well for age. Just had been as hungry. Should get more interested when stomach cleared from mucous. Monitoring cont. Patient Name: Renee Hudson Today's Date: 04/24/2014 Reason for consult: Infant weight loss   Maternal Data    Feeding Feeding Type: Breast Fed Length of feed: 50 min  LATCH Score/Interventions                      Lactation Tools Discussed/Used     Consult Status Consult Status: Follow-up Date: 04/24/14 Follow-up type: In-patient    Charyl DancerCARVER, [name] 04/24/2014, 6:38 AM

## 2014-04-24 NOTE — Discharge Summary (Signed)
Obstetric Discharge Summary Reason for Admission: induction of labor Prenatal Procedures: ultrasound Intrapartum Procedures: cesarean: low cervical, transverse Postpartum Procedures: none Complications-Operative and Postpartum: none Hemoglobin  Date Value Ref Range Status  04/23/2014 11.2* 12.0 - 15.0 g/dL Final     HCT  Date Value Ref Range Status  04/23/2014 31.1* 36.0 - 46.0 % Final    Physical Exam:  General: alert and cooperative Lochia: appropriate Uterine Fundus: firm Incision: healing well DVT Evaluation: No evidence of DVT seen on physical exam. Negative Homan's sign. No cords or calf tenderness. No significant calf/ankle edema.  Discharge Diagnoses: Term Pregnancy-delivered  Discharge Information: Date: 04/24/2014 Activity: pelvic rest Diet: routine Medications: PNV, Ibuprofen and Percocet Condition: stable Instructions: refer to practice specific booklet Discharge to: home   Newborn Data: Live born female  Birth Weight: 8 lb 6.8 oz (3820 g) APGAR: 9, 10  Home with mother.  Daley Gosse G 04/24/2014, 8:05 AM

## 2014-04-26 ENCOUNTER — Ambulatory Visit (HOSPITAL_COMMUNITY)
Admission: RE | Admit: 2014-04-26 | Discharge: 2014-04-26 | Disposition: A | Discharge status: Home / Self Care | Payer: BC Managed Care – PPO | Source: Ambulatory Visit | Attending: Obstetrics and Gynecology | Admitting: Obstetrics and Gynecology

## 2014-04-26 NOTE — Lactation Note (Signed)
Lactation Consult  Mother's reason for visit: Baby loosing weight - not getting enough food  Visit Type:  Feeding assessment due to weight loss  Appointment Notes: Confirmed via phone , per mom, Dr. Boneta LucksApt this am, baby's weight a concern, Dr. Recommended seeing LC  Consult:  Initial Lactation Consultant:  Kathrin GreathouseIorio, Kal[place] Camps Ann  ________________________________________________________________________  Baby's Name: Renee Hudson  Date of Birth: 04/22/2012  Pediatrician: Dr. Nelda Marseillearey Williams  Gender: female  Gestational Age: 5936w1d (At Birth)  Birth Weight: 8 lb 6.8 oz (3820 g)  Weight at Discharge: Weight: 7 lb 10 oz (3459 g) Date of Discharge: 04/24/2014  Filed Weights   04/22/14 0041 04/23/14 0004 04/23/14 2310  Weight: 8 lb 6.8 oz (3820 g) 8 lb 0.2 oz (3635 g) 7 lb 10 oz (3459 g)  Last weight taken from location outside of [facility]: 7-6.5 oz  Location:Pediatrician's office  Weight today: 7-8.5 oz    ________________________________________________________________________  Mother's Name: Mikki HarborLauren Spoerl Type of delivery:  C/section , ( per mom induced and ended up with a C/section )  Breastfeeding Experience:  Per mom discomfort and trouble keeping the baby awake with the feeding  Maternal Medical Conditions:  None  Maternal Medications:  PNV , pain med , post C- section   ________________________________________________________________________  Breastfeeding History (Post Discharge)  Frequency of breastfeeding: every 2-3 hours  Duration of feeding:  15-20  mins   Pumping - Per  Mom has a DEBP Medela , and using the #24 flange , and comfortable per mom  Volume - per mom milk came in late last night and today - tried pumping and hardly could pump off any , just alittle in the bottom of the bottle   Supplementing - Per mom , Today the Dr. Rana SnareLOwe wants me to supplement after each feeding due to a pound weight loss, and to start off  with 15 - 20ml after feeding at the breast  and increase. At 215 pm tried feeding Ray[place] Citizenaroline with a bottle and she only took 5 ml of Enfamil.Seemed sleepy.  Infant Intake and Output Assessment  Voids:  Per mom today 5  in 24 hrs.  Color:  Clear yellow Stools:  Per mom today 5-6  in 24 hrs.  Color:  Yellow  ________________________________________________________________________  Maternal Breast Assessment  Breast:  Full to areas of engorgement , lateral aspects of both breast and upper mid portion of the right breast  Nipple:  Flat- Areolas semi compressible , improved with hand expressing off some fullness. ( nipples appear healthy pink , no breakdown)  Pain level:  2 - lateral aspects of both breast tender to touch  Pain interventions:  Expressed breast milk  _______________________________________________________________________ Feeding Assessment/Evaluation  Initial feeding assessment: Baby sleepy until she was undressed for weight check, and diaper changed.                                                 Color pink,   Infant's oral assessment:  WNL , tongue noted to stretch over gum line   Positioning:  Football Right breast  LATCH documentation: resized mom for nipple shield , has been using a #20 NS at home , ( LC applied and felt it was to tight)  And didn't accommodate the areola . Switched to #24 NS , and was a better fit, instilled formula into the top and baby latched with depth with assist   Latch:  2 = Grasps breast easily, tongue down, lips flanged, rhythmical sucking.  Audible swallowing:  2 = Spontaneous and intermittent  Type of nipple:  1 = Flat  Comfort (Breast/Nipple):  1 = Filling, red/small blisters or bruises, mild/mod discomfort  Hold (Positioning):  1 = Assistance needed to correctly position infant at breast and maintain latch  LATCH score:  7   Attached assessment:  Shallow @ 1st , eased chin downward and the depth was obtained   Lips flanged:  Yes.    Lips  untucked:  Yes.    Suck assessment:  Nutritive , for a few suck swallow pattern and then once the formula was out of the nipple shield, baby was non -nutritive. Applied a SNS with formula on top of the Nipple shield and the Baby did so much better to stay in a consistent swallowing pattern.  Fed for 12-15 mins ,majority of right breast softened , but mid lateral right still had the nodules.   Tools:  Nipple shield 24 mm, Curved tip syringe and Supplemental nutrition system Instructed on use and cleaning of tool:  Yes.    Pre-feed weight:  3412g , 7-8.3 oz  Post-feed weight:  3436g , 7-9.2 oz  Amount transferred:  24 ml ( no breast milk transferred from mom)  Amount supplemented: 24 ml ( 20 ml was from the SNS , 4 ml was from  curved tip syringe instilled into the top of the Nipple shield at the breast.   LC impression - Mom's milk is in and boarder line engorgement , also exhausted. LC feels mom is not relaxing enough so it is preventing her milk from letting down well. Adding the SNS , allowed the baby to stay at the breast and to keep the baby in a consistent pattern. ( mom even commented what a difference it made )   Additional Feeding Assessment -   Infant's oral assessment:  See above   Positioning:  Football Right breast  LATCH documentation: re-latched with just the nipple shield #24 , instilled formula in the top of the Nipple shield and the baby fed consistently for  10-12 mins   Latch:  2 = Grasps breast easily, tongue down, lips flanged, rhythmical sucking.  Audible swallowing:  2 = Spontaneous and intermittent  Type of nipple:  1 = Flat  Comfort (Breast/Nipple):  1 = Filling, red/small blisters or bruises, mild/mod discomfort  Hold (Positioning):  1 = Assistance needed to correctly position infant at breast and maintain latch  LATCH score: 7   Attached assessment:  Deep  Lips flanged:  Yes.    Lips untucked:  Yes.    Suck assessment:  Nutritive intermittently , and  with stimulation was able to get back into a good pattern   Tools:  Nipple shield 24 mm Instructed on use and cleaning of tool:  Yes.    Pre-feed weight:  3432g , 7-9.1 oz  Post-feed weight: 3444g , 7-9.5oz  Amount transferred:  12 ml  Amount supplemented:  4 ml was EBM instilled into the top of the Nipple shield , 10 ml from  The breast    Total amount pumped post feed:  22 ml   Total amount transferred:  10 ml  Total supplement given: 26 ml  Total amount for feeding = 36  ml , and baby was sound asleep , relatched on the the left breast after icing , pumping and baby didn't seem interested.  Important - Stressed to mom the calories for the Baby need to be progressing upward. ( per mom aware due to weight loss , and the need to increase [name]'s energy level )   LC 's consult impression - mom is very tired , milk is slow to let down, also boarder line engorged , improved with feeding , ice and pumping with a DEBP, ( stressed sleep when mom goes home )                                                 Due to mom flat tissue due to swelling an full breast Nipple shield is indicated , will reassess next Wednesday 11/4 LC F/U visit. Baby was so much more alert after baby received                                                 It's feeding. Baby is able to latch well with a #24 Nipple shield.   LCplan - written copy discussed with mom and grandma               - Goal -[place] Citizen needs to be gaining and needs to feed with feeding cues at least every 3 hours.              - Goal - Protect establishing milk supply               - Mom - Rest , naps , plenty fluids, esp water , nutritious snacks and meals               - Feedings - every 2 1/2 - 3 hours and with feeding cues               - Steps for latching - Breast massage, hand express, prepump 3-5 mins if to full , apply nipple shield , and instill EBM or formula into the top for an appetizer               - Latch - work with [place] Citizen to open  wide while latching ,               - OPtion #1 - apply SNS with supplement EBM or formula on top of the NS , latch or   Option #2steps for latching , apply NS , used a curved tip syringe with EBM or formula in to the top , and latch , feed for 15 - and supplement after with EBM or formula ina Medium sized nipple .               - Engorgement prevention and tx - Full breast = good sign , Breast greater than full heading towards tight to firm to heard = engorgement - have to ice 15 -20 mins and then steps for latching.               - Average feeding 15 -20 mins , 10 mins or less a snack.

## 2014-05-01 ENCOUNTER — Encounter (HOSPITAL_COMMUNITY): Payer: Self-pay | Admitting: Obstetrics & Gynecology

## 2014-05-03 ENCOUNTER — Ambulatory Visit (HOSPITAL_COMMUNITY)
Admission: RE | Admit: 2014-05-03 | Discharge: 2014-05-03 | Disposition: A | Discharge status: Home / Self Care | Payer: BC Managed Care – PPO | Source: Ambulatory Visit | Attending: Obstetrics and Gynecology | Admitting: Obstetrics and Gynecology

## 2014-05-03 NOTE — Lactation Note (Signed)
Lactation Consult  Mother's reason for visit: follow up from last week for weight check and feeding assessment  Visit Type:  F/U feeding assessment and weight check , ( use of nipple shield )  Appointment Notes:  Using a # 24 NS, extra pumping,  Consult:  Follow-Up Lactation Consultant:  Renee Hudson  ________________________________________________________________________ Renee Hudson Name: Renee Hudson Date of Birth: 04/22/2012 Pediatrician: Dr. Nelda Hudson  Gender: female Gestational Age: [redacted]w[redacted]d (At Birth) Birth Weight: 8 lb 6.8 oz (3820 g) Weight at Discharge: Weight: 7 lb 10 oz (3459 g)Date of Discharge: 04/24/2014 Filed Weights   04/22/14 0041 04/23/14 0004 04/23/14 2310  Weight: 8 lb 6.8 oz (3820 g) 8 lb 0.2 oz (3635 g) 7 lb 10 oz (3459 g)   Last weight taken from location [facility] of [facility]:8-2 oz  Location:[facility] Weight today:8-4.9 oz 3768 g         ________________________________________________________________________  Mother's Name: Renee Hudson Type of delivery:  C/section  Breastfeeding Experience:  Doing much better  Maternal Medical Conditions:  None  Maternal Medications:  PNV, Motrin 600 mg , and Percocet PRN , stool softener   ________________________________________________________________________  Breastfeeding History (Post Discharge)  Frequency of breastfeeding:  Days every 2 1/2 - 3 hours , evening - same , nights sometimes 4 hours  Duration of feeding:  For 20 mins , majority time ,   Pumping - per mom using a DEBP Medela every other feeding for 15 mins  2 oz from each breast   Supplementing - per mom only breast milk from a bottle , and pump both breast for stimulation   Infant Intake and Output Assessment  Voids:  8  in 24 hrs.  Color:  Clear yellow Stools: 8  in 24 hrs.  Color:  Yellow  ________________________________________________________________________  Maternal Breast  Assessment  Breast:  Full ( boarder line engorged lateral aspects of both breast  )  - per mom did not post pump after  last feeding at the breast because baby was coming for this apt today. Nipple:  Erect Pain level:  0 Pain interventions:  Expressed breast milk  _______________________________________________________________________ Feeding Assessment/Evaluation  Initial feeding assessment: Baby alert . And showing feeding cues , well hydrated, color pink ,                                                 Content after feeding   Infant's oral assessment:  WNL  Positioning:  Football Right breast   Reviewed application of the Nipple shield and encouraged mom to roll it on like a sleeve , after several practices mom was improving with application  #24 Nipple shield still the correct size , especially when the baby released from the breast , the nipple was pulled up in to the NS in a comfortable fashion  And milk noted to be in the nipple shield.   LATCH documentation:  Latch:  2 = Grasps breast easily, tongue down, lips flanged, rhythmical sucking.  Audible swallowing:  2 = Spontaneous and intermittent  Type of nipple:  2 = Everted at rest and after stimulation  Comfort (Breast/Nipple):  1 = Filling, red/small blisters or bruises, mild/mod discomfort  Hold (Positioning):  2 = No assistance needed to correctly position infant at breast  LATCH score:  9   Attached assessment:  Deep  Lips  flanged:  Yes.    Lips untucked:  Yes.    Suck assessment:  Nutritive  Tools:  Nipple shield 24 mm Instructed on use and cleaning of tool:  Yes.    Pre-feed weight:  3768  g  (8  lb. 4.9  oz.) Post-feed weight:  3822 g (8  lb. 6.8  oz.) Amount transferred:  54  ml Amount supplemented:  None needed   No  Baby finished feeding on the right breast and seemed content , and not showing feeding cues , so mom post pumped.   Total amount pumped post feed:  3 1/2 oz total .   Total amount  transferred:  54  Ml ( [name] content after feeding and didn't seem interested in relatching.  Total supplement given:  None   Lactation Consultant's impression : Baby has gained  well since last week and latching with a Nipple shield has improved and lips more  flanged with latch.                                                                Mom comfortable with latching and obtaining depth. Mom seemed excited breast feeding is going so well and baby is gaining.                                                                Mom is doing extra post pumping due to using a Nipple Shield.                                                                Per mom has tried latching without the NS , without success. LC felt the baby's chin needed to grow and also develop stronger muscles and then                                                                 Working on weaning off the Nipple shield could be possible.  Lactation Plan of care : Per mom [facility] weight check Friday November 6th                                           [facility]                    Praised mom for her efforts breast feeding                                           Mom - rest  , naps , plenty fluids, especially water, nutritious snacks and meals                                           Feedings - every 2 1/2 - 3 hours and with feeding cues                                                               ( if by 10 pm baby has had 8 good feedings could let the baby sleep 4 hours )                                           Steps for latching - review application of the Nipple shield ( reminder to roll the nipple shield on like a sleeve)                                                                              Also to check lip  lines, flanged position                                          Important always soften 1st breast well prior to offering 2nd breast, if to full to start , release off milk with hand expressing                                           Post pump - after  4 feedings a day for 10 mins both breast  Or feed 1st breast , soften well and pump 2nd breast .                   If Renee Hudson Renee Hudson receiving a bottle for feeding - need to pump both  breast for 15 -20 mins                                           Change bottle back to medium nipple - Medela

## 2016-12-04 DIAGNOSIS — N911 Secondary amenorrhea: Secondary | ICD-10-CM | POA: Diagnosis not present

## 2016-12-18 DIAGNOSIS — Z3480 Encounter for supervision of other normal pregnancy, unspecified trimester: Secondary | ICD-10-CM | POA: Diagnosis not present

## 2016-12-18 LAB — OB RESULTS CONSOLE HEPATITIS B SURFACE ANTIGEN: Hepatitis B Surface Ag: NEGATIVE

## 2016-12-18 LAB — OB RESULTS CONSOLE ABO/RH: RH TYPE: NEGATIVE

## 2016-12-18 LAB — OB RESULTS CONSOLE RPR: RPR: NONREACTIVE

## 2016-12-18 LAB — OB RESULTS CONSOLE RUBELLA ANTIBODY, IGM: Rubella: IMMUNE

## 2016-12-18 LAB — OB RESULTS CONSOLE HIV ANTIBODY (ROUTINE TESTING): HIV: NONREACTIVE

## 2016-12-18 LAB — OB RESULTS CONSOLE ANTIBODY SCREEN: Antibody Screen: NEGATIVE

## 2016-12-18 LAB — OB RESULTS CONSOLE GC/CHLAMYDIA
Chlamydia: NEGATIVE
Gonorrhea: NEGATIVE

## 2016-12-19 DIAGNOSIS — Z36 Encounter for antenatal screening for chromosomal anomalies: Secondary | ICD-10-CM | POA: Diagnosis not present

## 2016-12-19 DIAGNOSIS — Z3480 Encounter for supervision of other normal pregnancy, unspecified trimester: Secondary | ICD-10-CM | POA: Diagnosis not present

## 2016-12-19 DIAGNOSIS — Z3401 Encounter for supervision of normal first pregnancy, first trimester: Secondary | ICD-10-CM | POA: Diagnosis not present

## 2017-01-05 DIAGNOSIS — Z3A12 12 weeks gestation of pregnancy: Secondary | ICD-10-CM | POA: Diagnosis not present

## 2017-01-05 DIAGNOSIS — Z3491 Encounter for supervision of normal pregnancy, unspecified, first trimester: Secondary | ICD-10-CM | POA: Diagnosis not present

## 2017-01-05 DIAGNOSIS — Z36 Encounter for antenatal screening for chromosomal anomalies: Secondary | ICD-10-CM | POA: Diagnosis not present

## 2017-01-05 DIAGNOSIS — Z3682 Encounter for antenatal screening for nuchal translucency: Secondary | ICD-10-CM | POA: Diagnosis not present

## 2017-02-16 DIAGNOSIS — Z363 Encounter for antenatal screening for malformations: Secondary | ICD-10-CM | POA: Diagnosis not present

## 2017-02-16 DIAGNOSIS — Z3A18 18 weeks gestation of pregnancy: Secondary | ICD-10-CM | POA: Diagnosis not present

## 2017-04-14 DIAGNOSIS — O360920 Maternal care for other rhesus isoimmunization, second trimester, not applicable or unspecified: Secondary | ICD-10-CM | POA: Diagnosis not present

## 2017-04-14 DIAGNOSIS — Z23 Encounter for immunization: Secondary | ICD-10-CM | POA: Diagnosis not present

## 2017-04-14 DIAGNOSIS — Z3A26 26 weeks gestation of pregnancy: Secondary | ICD-10-CM | POA: Diagnosis not present

## 2017-04-14 DIAGNOSIS — Z3480 Encounter for supervision of other normal pregnancy, unspecified trimester: Secondary | ICD-10-CM | POA: Diagnosis not present

## 2017-04-14 DIAGNOSIS — O4402 Placenta previa specified as without hemorrhage, second trimester: Secondary | ICD-10-CM | POA: Diagnosis not present

## 2017-04-16 ENCOUNTER — Other Ambulatory Visit (HOSPITAL_COMMUNITY): Payer: Self-pay | Admitting: Obstetrics and Gynecology

## 2017-04-16 DIAGNOSIS — O358XX0 Maternal care for other (suspected) fetal abnormality and damage, not applicable or unspecified: Secondary | ICD-10-CM

## 2017-04-16 DIAGNOSIS — O35HXX0 Maternal care for other (suspected) fetal abnormality and damage, fetal lower extremities anomalies, not applicable or unspecified: Secondary | ICD-10-CM

## 2017-04-16 DIAGNOSIS — Z3689 Encounter for other specified antenatal screening: Secondary | ICD-10-CM

## 2017-04-16 DIAGNOSIS — N2889 Other specified disorders of kidney and ureter: Secondary | ICD-10-CM

## 2017-04-16 DIAGNOSIS — Z3A28 28 weeks gestation of pregnancy: Secondary | ICD-10-CM

## 2017-04-23 ENCOUNTER — Encounter (HOSPITAL_COMMUNITY): Payer: Self-pay | Admitting: *Deleted

## 2017-04-24 ENCOUNTER — Encounter (HOSPITAL_COMMUNITY): Payer: Self-pay

## 2017-04-24 ENCOUNTER — Other Ambulatory Visit (HOSPITAL_COMMUNITY): Payer: Self-pay | Admitting: Obstetrics and Gynecology

## 2017-04-24 ENCOUNTER — Other Ambulatory Visit: Payer: Self-pay

## 2017-04-24 ENCOUNTER — Ambulatory Visit (HOSPITAL_COMMUNITY)
Admission: RE | Admit: 2017-04-24 | Discharge: 2017-04-24 | Disposition: A | Discharge status: Home / Self Care | Payer: BLUE CROSS/BLUE SHIELD | Source: Ambulatory Visit | Attending: Obstetrics and Gynecology | Admitting: Obstetrics and Gynecology

## 2017-04-24 ENCOUNTER — Other Ambulatory Visit (HOSPITAL_COMMUNITY): Payer: Self-pay | Admitting: *Deleted

## 2017-04-24 DIAGNOSIS — O35HXX0 Maternal care for other (suspected) fetal abnormality and damage, fetal lower extremities anomalies, not applicable or unspecified: Secondary | ICD-10-CM

## 2017-04-24 DIAGNOSIS — Z3689 Encounter for other specified antenatal screening: Secondary | ICD-10-CM | POA: Diagnosis not present

## 2017-04-24 DIAGNOSIS — O358XX0 Maternal care for other (suspected) fetal abnormality and damage, not applicable or unspecified: Secondary | ICD-10-CM

## 2017-04-24 DIAGNOSIS — O283 Abnormal ultrasonic finding on antenatal screening of mother: Secondary | ICD-10-CM | POA: Diagnosis not present

## 2017-04-24 DIAGNOSIS — IMO0002 Reserved for concepts with insufficient information to code with codable children: Secondary | ICD-10-CM

## 2017-04-24 DIAGNOSIS — Z3A28 28 weeks gestation of pregnancy: Secondary | ICD-10-CM

## 2017-04-24 DIAGNOSIS — N2889 Other specified disorders of kidney and ureter: Secondary | ICD-10-CM

## 2017-04-24 DIAGNOSIS — Z0489 Encounter for examination and observation for other specified reasons: Secondary | ICD-10-CM

## 2017-04-24 NOTE — Consult Note (Signed)
Maternal Fetal Medicine Consultation  Requesting Provider(s): Leger  Primary OB: Leger Reason for consultation:Multiple soft markers for aneuploidy  HPI: 36yo P1001 at 28+2 weeks who had US in Dr. Arlester MarkerLeger's office which showed EIF, pyelectasis, and short femur/humerus. She had undergone first trimester screening and had a <1 in 10,000 risk for trisomies 13/18/21. The pregnancy has been unremarkable le so far. OB History: OB History    Gravida Para Term Preterm AB Living   2 1 1     1    SAB TAB Ectopic Multiple Live Births           1      PMH:  Past Medical History:  Diagnosis Date  . Hx of varicella   . Medical history non-contributory     PSH:  Past Surgical History:  Procedure Laterality Date  . CESAREAN SECTION N/A 04/22/2014   Procedure: CESAREAN SECTION;  Surgeon: Mitchel HonourMegan Morris, DO;  Location: [facility];  Service: Obstetrics;  Laterality: N/A;  . WISDOM TOOTH EXTRACTION     Meds: See EPIC section Allergies: NKDA FH: See EPIC section Soc: See EPIC section  Review of Systems: no vaginal bleeding or cramping/contractions, no LOF, no nausea/vomiting. All other systems reviewed and are negative.  PE:  VS: See EPIC section GEN: well-appearing female ABD: gravid, NT  Please see separate document for fetal ultrasound report.  A/P: Sonographic findings at high risk for genetic abnormality Specifically, we found short femurs, humeri, ulnas and tibias, as well as unilateral pyelectasis, an echogenic intracardiac focus, and abnormal hand posturing with clenching and overlapping digits. Taken together this has a profound effect on her risk for aneuploidy and other genetic defects. Fortunately, she was able to stay and see our genetic counselor, whose report will be sent separately. We will plan to recheck a scan in 3 weeks  Thank you for the opportunity to be a part of the care of Entergy CorporationLauren Hudson. Please contact our office if we can be of further assistance.   I spent  approximately 30 minutes with this patient with over 50% of time spent in face-to-face counseling.

## 2017-04-24 NOTE — Addendum Note (Signed)
Encounter addended by: Levonne HubertStalter, Mercedees Convery M, RDMS, RVT on: 04/24/2017 11:23 AM<BR>    Actions taken: Imaging Exam ended

## 2017-04-27 NOTE — Progress Notes (Addendum)
Genetic Counseling  High-Risk Gestation Note  Appointment Date:  04/24/2017 Referred By: Tyson Dense, * Date of Birth:  05/27/1981 Partner:  Ricka Burdock   Pregnancy History: [id number] Estimated Date of Delivery: 07/15/17 Estimated Gestational Age: 20w2dAttending: MGriffin Dakin MD  I met with Mrs. LElaina Hoopsand her husband, Mr. AMiara Emminger for genetic counseling because of abnormal ultrasound findings.   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), EIF, right pyelectasis  Discussed possible etiologies including single gene (skeletal dysplasia), chromosomal, multifactorial, variation of normal  Reviewed options for additional screening  NIPS for aneuploidy- elected to pursue MaterniTGenome today  NIPS for single gene conditions- elected to pursue Vistara today  Expanded carrier screening - elected to pursue Counsyl today  Ongoing ultrasound- follow-up scheduled 05/15/17  Reviewed options for diagnostic testing, including risks, benefits, limitations and alternatives- declined amniocentesis  Reviewed family history concerns  We began by reviewing the ultrasound in detail.  Ultrasound performed today visualized shortened long bones. Humerus measurements less than the 5%tile and femur measurements less than 3%tile (approximately 3-4 weeks behind for expected gestational age). Ulna and tibia were also less than 5%tile for gestational age. Echogenic intracardiac focus and right pyelectasis (urinary tract dilation) were present.   Atypical posturing of the fetal fingers was also observed in some views, and BPD and HC measured >97%tile for gestational age. Complete ultrasound results reported under separate cover.   We discussed these findings in detail.  Specifically, we discussed that ultrasound differences can occur as isolated, nonsyndromic birth defects/variants, or as features of an underlying genetic syndrome.  The risk  for a genetic etiology increases with the presence of multiple fetal anatomic differences; however, the specific risk depends on the constellation of findings.  Based on the combination of ultrasound findings, and considering Mrs.Manter's normal first trimester screening result and her age related risk for fetal aneuploidy, the adjusted risk for fetal aneuploidy is estimated to be ~5% or less.  We reviewed chromosomes, nondisjunction, and the common features of Down syndrome, trisomy 142 and trisomy 127  In addition, we discussed the slight increase in risk for other chromosome aberrations including microdeletions, duplications, insertions, and translocations.    This couple was then counseled regarding the availability of amniocentesis including the associated risks, benefits, and limitations.  They understand that chromosome analysis can be performed both prenatally (amniotic fluid) and postnatally (peripheral blood or cord blood).  Additionally, we discussed the availability of microarray analysis, which can also be performed pre and postnatally.  They were counseled that microarray analysis is a molecular based technique in which a test sample of DNA (fetal) is compared to a reference (normal) genome in order to determine if the test sample has any extra or missing genetic information.  Microarray analysis allows for the detection of genetic deletions and duplications that are 1732times smaller than those identified by routine chromosome analysis.  We discussed that recent publications show that approximately 6% of patients with an abnormal fetal ultrasound and a normal fetal karyotype had a significant microdeletion/microduplication detected by prenatal microarray analysis.  Given Mrs. LShanda Howellsadvanced gestational age, this couple declined amniocentesis for karyotype and microarray analysis.  They were then counseled regarding the option of noninvasive prenatal screening (NIPS)/prenatal cell  free DNA testing for aneuploidy.  Specifically we discussed the option of MaterniTGenome through [employer] In addition to screening for common fetal trisomies, this screen assesses cell free DNA from each chromosome in maternal blood  and can detect chromosome imbalances that are greater than or equal to 7Mb, in addition to select microdeletion syndromes ( 22q11, 15q11, 11q23, 8q24, 5p15, 4p16, and 1p36). The sensitivity of this test for gains/losses of chromosome material greater than 7Mb is reported to be >95%. We discussed limitations and benefits of this screen including that it is not diagnostic and cannot assess for all chromosome conditions nor assess for single gene conditions. After careful consideration, Mrs. Lavergne elected to proceed with NIPS for aneuploidy (MaterniTGenome through [employer]) today. These results will be available in approximately 7-10 days.    We then discussed other possible explanations for the above discussed ultrasound findings including single gene conditions.  Single gene conditions are typically tested for postnatally, based on the recommendation of a medical geneticist, unless ultrasound findings or the family history are strongly suggestive of a specific syndrome. Based on the finding of shortened long bones, specifically rhizomelic shortening, they were counseled regarding the increase in risk for skeletal dysplasias, including achondroplasia.  We discussed that there are over 200 skeletal dysplasias with a wide range of features. Achondroplasia is the most common of the skeletal dysplasias, but still relatively rare, with an incidence of approximately 1 in 26,000 individuals.  Achondroplasia is characterized by disproportionately short limbs, primarily the proximal or rhizomelic segment (femurs and humeri), short fingers often held in a "trident" configuration, moderately enlarged head, depressed nasal bridge, characteristic vertebral changes, and a  modestly constricted chest. We discussed that skeletal dysplasias can follow a range of inheritance patterns with a large number following autosomal dominant (including occurring due to de novo pathogenic changes), and less commonly autosomal recessive or X-linked inheritance.   We discussed that there is a newer NIPS platform (Vistara through [place]) that is able to assess for mutations in a panel of 30 selected genes. The conditions selected for this panel are autosomal dominant or X-linked conditions that typically occur due to de novo gene variations. This panel includes some (but not all) genes that can cause certain skeletal dysplasias (including FGFR3). We reviewed the methodology behind this screen and that the reported detection rate ranges from 43% to greater than 96%, depending upon the specific condition and specific gene. We reviewed possible results including positive, negative, unexpected findings, and no result. We reviewed limitations of the test including that it is not diagnostic, it relies on a high enough fetal fraction in the sample, and that if the patient carriers a mutation in one of the genes on the panel, analysis cannot be performed for the pregnancy. We also discussed that if a mutation is identified in the paternal sample, this will also be reported to the couple. We reviewed the billing process and possible cost of the test. After careful consideration, Mrs. Knotek elected to pursue Vistara (single gene NIPS) at the time of today's visit. Results are anticipated to be available in 2-3 weeks.   Regarding autosomal recessive conditions, we discussed the screening option of expanded pan-ethnic carrier screening for the patient. We discussed the option of carrier screening for a select panel of autosomal recessive and some X-linked conditions, some of which but not all can have shortened long bones as an associated feature. ACOG currently recommends that all patients be offered  carrier screening for cystic fibrosis, spinal muscular atrophy and hemoglobinopathies. In addition, they were counseled that there are a variety of genetic screening laboratories that have pan-ethnic, or expanded, carrier screening panels, which evaluate carrier status for a wide range of genetic conditions.  Some of these conditions are severe and actionable, but also rare; others occur more commonly, but are less severe. We discussed that testing options range from screening for a single condition to panels of more than 200 autosomal or X-linked genetic conditions. The prevalence of each condition varies (and often varies with ethnicity). Thus the couples' background risk to be a carrier for each of these various conditions would range, and in some cases be very low or unknown. We reviewed that a negative carrier screen would thus reduce, but not eliminate the chance to be a carrier for these conditions. For some conditions included on specific pan-ethnic carrier screening panels, the phenotype may not yet be well defined. For the majority of conditions on pan-ethnic carrier screening panels, identification of carrier status is not expected to be associated with medical features for the carrier; However, there are currently few exceptions where carrier status has been shown to increase the chance for certain medical concerns. We reviewed that in the event that one partner is found to be a carrier for one or more conditions, carrier screening would be available to the partner for those conditions. We discussed the risks, benefits, and limitations of carrier screening with the couple. After thoughtful consideration of their options, Ms. Josanna Hefel elected to pursue expanded pan-ethnic carrier screening (including ACOG recommended panel) through [facility] laboratory, which includes 175 conditions on the panel.   They were also counseled that shortened long bones can be a feature in a variety of other genetic  conditions, but can also be a normal variant of growth or the result of familial or constitutional short stature.   We discussed that the prognosis and postnatal management depend on the underlying etiology of the shortened bones.  We reviewed the availability of a postnatal consult with a medical geneticist to help determine the specific etiology of the described differences, if warranted.  Follow-up ultrasound was scheduled for 05/15/17.   Both family histories were reviewed in detail and found to be noncontributory for birth defects, intellectual disability, and known genetic conditions- specifically, skeletal dysplasias. Mrs. .Clinkscale reported that she is approximately 68'1" and Mr. Massmann reported that he is approximately 5'7". Additionally, Mrs. Hauter reported that her paternal relatives are all less than 29'0, and some female paternal relatives are less than 30'0".   Without further information regarding the provided family history, an accurate genetic risk cannot be calculated. Further genetic counseling is warranted if more information is obtained.  Mrs. Egley denied exposure to environmental toxins or chemical agents. She denied the use of tobacco or street drugs. She reported alcohol exposure very early in pregnancy, prior to being aware of the pregnancy. The all-or-none period was discussed, meaning exposures that occur in the first 4 weeks of gestation are typically thought to either not affect the pregnancy at all or result in a miscarriage. She denied significant viral illnesses during the course of her pregnancy.   I counseled this couple regarding the above risks and available options.  The approximate face-to-face time with the genetic counselor was 45 minutes.  Chipper Oman, MS Certified Genetic Counselor 04/27/2017

## 2017-04-28 ENCOUNTER — Telehealth (HOSPITAL_COMMUNITY): Payer: Self-pay | Admitting: MS"

## 2017-04-28 NOTE — Telephone Encounter (Signed)
Called Mikki HarborLauren Phenix to discuss her MaterniTGenome, cell free DNA test results through [facility]. Patient identified by name and DOB.  We reviewed that these are within normal limits, indicating that there is no extra chromosome 21, 13, 18, or X material present. Genome-wide copy number variant analysis was also within normal range, with approximate 96% sensitivity for variants greater than or equal to 7Mb. Additionally, analysis for select microdeletions were within normal range.  This testing identifies > 99% of pregnancies with trisomy 9521 and trisomy 5318, and >91% with trisomy 2313; the false positive rate is <0.1% for all conditions.  She understands that this testing does not identify all genetic conditions and does not assess for single gene conditions, including the majority of skeletal dysplasias.  All questions were answered to her satisfaction, she was encouraged to call with additional questions or concerns.  Quinn PlowmanKaren Gracee Ratterree, MS Patent attorneyCertified Genetic Counselor

## 2017-04-30 DIAGNOSIS — M545 Low back pain: Secondary | ICD-10-CM | POA: Diagnosis not present

## 2017-04-30 DIAGNOSIS — M2560 Stiffness of unspecified joint, not elsewhere classified: Secondary | ICD-10-CM | POA: Diagnosis not present

## 2017-04-30 DIAGNOSIS — M25551 Pain in right hip: Secondary | ICD-10-CM | POA: Diagnosis not present

## 2017-04-30 DIAGNOSIS — R293 Abnormal posture: Secondary | ICD-10-CM | POA: Diagnosis not present

## 2017-05-01 ENCOUNTER — Other Ambulatory Visit (HOSPITAL_COMMUNITY): Payer: Self-pay

## 2017-05-01 DIAGNOSIS — Z3490 Encounter for supervision of normal pregnancy, unspecified, unspecified trimester: Secondary | ICD-10-CM | POA: Diagnosis not present

## 2017-05-01 DIAGNOSIS — O283 Abnormal ultrasonic finding on antenatal screening of mother: Secondary | ICD-10-CM | POA: Diagnosis not present

## 2017-05-08 ENCOUNTER — Telehealth (HOSPITAL_COMMUNITY): Payer: Self-pay | Admitting: MS"

## 2017-05-08 ENCOUNTER — Other Ambulatory Visit (HOSPITAL_COMMUNITY): Payer: Self-pay

## 2017-05-08 DIAGNOSIS — M2560 Stiffness of unspecified joint, not elsewhere classified: Secondary | ICD-10-CM | POA: Diagnosis not present

## 2017-05-08 DIAGNOSIS — R293 Abnormal posture: Secondary | ICD-10-CM | POA: Diagnosis not present

## 2017-05-08 DIAGNOSIS — M25551 Pain in right hip: Secondary | ICD-10-CM | POA: Diagnosis not present

## 2017-05-08 DIAGNOSIS — M545 Low back pain: Secondary | ICD-10-CM | POA: Diagnosis not present

## 2017-05-08 NOTE — Telephone Encounter (Signed)
Mrs. Mikki HarborLauren Person called to inquire about status of pending testing results, Vistara, single gene NIPS. Discussed that results are still pending and that [facility] sent a fax today stating that the test results are delayed beyond the expected two week turnaround time and are expected to result within the next 7 days. Discussed with Mrs. Gaynell FaceMarshall that either myself or one of the other genetic counselors from our group will be in contact once these result. Mrs. Gaynell FaceMarshall has follow-up ultrasound scheduled next Friday, and I discussed that I will be away at a conference and thus unable to follow-up with them in person. However, my genetic counseling colleague will be in the office that day and would be available to touch base with the couple if they have questions. Mrs. Gaynell FaceMarshall asked what the plan would be if Javier DockerVistara is normal for the skeletal dysplasias. We reviewed that Javier DockerVistara is not considered 100%, specifically for achondroplasia detection rate is approximately 96%, so there would still be a residual risk, though the specific skeletal dysplasias on Vistara's panel would be less likely to be suspected. Discussed that ultrasound findings can help guide pregnancy management and that postnatal evaluation will also be available for the baby after delivery. We also reviewed that Mrs. Grego's expanded carrier screening (Counsyl) was within normal limits for the conditions screened. Mrs. Gaynell FaceMarshall received these results through e-mail from [facility]. All questions were answered to her satisfaction at this time. She was encouraged to contact us with additional questions or concerns.   Clydie BraunKaren Aubreyanna Dorrough 05/08/2017 2:16 PM

## 2017-05-11 DIAGNOSIS — R293 Abnormal posture: Secondary | ICD-10-CM | POA: Diagnosis not present

## 2017-05-11 DIAGNOSIS — M25551 Pain in right hip: Secondary | ICD-10-CM | POA: Diagnosis not present

## 2017-05-11 DIAGNOSIS — M545 Low back pain: Secondary | ICD-10-CM | POA: Diagnosis not present

## 2017-05-11 DIAGNOSIS — M2560 Stiffness of unspecified joint, not elsewhere classified: Secondary | ICD-10-CM | POA: Diagnosis not present

## 2017-05-14 DIAGNOSIS — M25551 Pain in right hip: Secondary | ICD-10-CM | POA: Diagnosis not present

## 2017-05-14 DIAGNOSIS — R293 Abnormal posture: Secondary | ICD-10-CM | POA: Diagnosis not present

## 2017-05-14 DIAGNOSIS — M2560 Stiffness of unspecified joint, not elsewhere classified: Secondary | ICD-10-CM | POA: Diagnosis not present

## 2017-05-14 DIAGNOSIS — M545 Low back pain: Secondary | ICD-10-CM | POA: Diagnosis not present

## 2017-05-15 ENCOUNTER — Ambulatory Visit (HOSPITAL_COMMUNITY)
Admission: RE | Admit: 2017-05-15 | Discharge: 2017-05-15 | Disposition: A | Discharge status: Home / Self Care | Payer: BLUE CROSS/BLUE SHIELD | Source: Ambulatory Visit | Attending: Obstetrics and Gynecology | Admitting: Obstetrics and Gynecology

## 2017-05-15 ENCOUNTER — Other Ambulatory Visit: Payer: Self-pay

## 2017-05-15 ENCOUNTER — Encounter (HOSPITAL_COMMUNITY): Payer: Self-pay

## 2017-05-15 ENCOUNTER — Encounter (HOSPITAL_COMMUNITY): Payer: Self-pay | Admitting: *Deleted

## 2017-05-15 ENCOUNTER — Inpatient Hospital Stay (HOSPITAL_COMMUNITY)
Admission: AD | Admit: 2017-05-15 | Discharge: 2017-05-15 | Disposition: A | Discharge status: Other Acute Inpatient Hospital | Payer: BLUE CROSS/BLUE SHIELD | Source: Ambulatory Visit | Attending: Obstetrics and Gynecology | Admitting: Obstetrics and Gynecology

## 2017-05-15 DIAGNOSIS — O24113 Pre-existing diabetes mellitus, type 2, in pregnancy, third trimester: Secondary | ICD-10-CM | POA: Diagnosis not present

## 2017-05-15 DIAGNOSIS — O99343 Other mental disorders complicating pregnancy, third trimester: Secondary | ICD-10-CM | POA: Diagnosis not present

## 2017-05-15 DIAGNOSIS — O4693 Antepartum hemorrhage, unspecified, third trimester: Secondary | ICD-10-CM | POA: Diagnosis not present

## 2017-05-15 DIAGNOSIS — N939 Abnormal uterine and vaginal bleeding, unspecified: Secondary | ICD-10-CM | POA: Diagnosis not present

## 2017-05-15 DIAGNOSIS — Z362 Encounter for other antenatal screening follow-up: Secondary | ICD-10-CM | POA: Diagnosis not present

## 2017-05-15 DIAGNOSIS — Z3A31 31 weeks gestation of pregnancy: Secondary | ICD-10-CM | POA: Diagnosis not present

## 2017-05-15 DIAGNOSIS — Z0489 Encounter for examination and observation for other specified reasons: Secondary | ICD-10-CM

## 2017-05-15 DIAGNOSIS — O4403 Placenta previa specified as without hemorrhage, third trimester: Secondary | ICD-10-CM | POA: Insufficient documentation

## 2017-05-15 DIAGNOSIS — O4433 Partial placenta previa with hemorrhage, third trimester: Secondary | ICD-10-CM | POA: Diagnosis not present

## 2017-05-15 DIAGNOSIS — Z3A30 30 weeks gestation of pregnancy: Secondary | ICD-10-CM | POA: Diagnosis not present

## 2017-05-15 DIAGNOSIS — O358XX0 Maternal care for other (suspected) fetal abnormality and damage, not applicable or unspecified: Secondary | ICD-10-CM | POA: Diagnosis not present

## 2017-05-15 DIAGNOSIS — O359XX0 Maternal care for (suspected) fetal abnormality and damage, unspecified, not applicable or unspecified: Secondary | ICD-10-CM | POA: Insufficient documentation

## 2017-05-15 DIAGNOSIS — Z3A34 34 weeks gestation of pregnancy: Secondary | ICD-10-CM | POA: Diagnosis not present

## 2017-05-15 DIAGNOSIS — F329 Major depressive disorder, single episode, unspecified: Secondary | ICD-10-CM | POA: Diagnosis not present

## 2017-05-15 DIAGNOSIS — O10013 Pre-existing essential hypertension complicating pregnancy, third trimester: Secondary | ICD-10-CM | POA: Diagnosis not present

## 2017-05-15 DIAGNOSIS — IMO0002 Reserved for concepts with insufficient information to code with codable children: Secondary | ICD-10-CM

## 2017-05-15 LAB — CBC
HCT: 33.3 % — ABNORMAL LOW (ref 36.0–46.0)
Hemoglobin: 11.5 g/dL — ABNORMAL LOW (ref 12.0–15.0)
MCH: 31.8 pg (ref 26.0–34.0)
MCHC: 34.5 g/dL (ref 30.0–36.0)
MCV: 92 fL (ref 78.0–100.0)
Platelets: 129 10*3/uL — ABNORMAL LOW (ref 150–400)
RBC: 3.62 MIL/uL — ABNORMAL LOW (ref 3.87–5.11)
RDW: 12.7 % (ref 11.5–15.5)
WBC: 12 10*3/uL — ABNORMAL HIGH (ref 4.0–10.5)

## 2017-05-15 MED ORDER — LACTATED RINGERS IV SOLN
INTRAVENOUS | Status: DC
  Administered 2017-05-15: 14:00:00 via INTRAVENOUS

## 2017-05-15 MED ORDER — MAGNESIUM SULFATE 40 G IN LACTATED RINGERS - SIMPLE
2.0000 g/h | INTRAVENOUS | Status: DC
  Administered 2017-05-15: 2 g/h via INTRAVENOUS
  Filled 2017-05-15: qty 40

## 2017-05-15 MED ORDER — BETAMETHASONE SOD PHOS & ACET 6 (3-3) MG/ML IJ SUSP
12.0000 mg | Freq: Once | INTRAMUSCULAR | Status: AC
  Administered 2017-05-15: 12 mg via INTRAMUSCULAR
  Filled 2017-05-15: qty 2

## 2017-05-15 MED ORDER — MAGNESIUM SULFATE BOLUS VIA INFUSION
4.0000 g | Freq: Once | INTRAVENOUS | Status: AC
  Administered 2017-05-15: 4 g via INTRAVENOUS
  Filled 2017-05-15: qty 500

## 2017-05-15 NOTE — MAU Provider Note (Signed)
History     CSN: [id number]  Arrival date and time: 05/15/17 1231   First Provider Initiated Contact with Patient 05/15/17 1329      Chief Complaint  Patient presents with  . Vaginal Bleeding   Vaginal Bleeding  The patient's pertinent negatives include no vaginal discharge. Associated symptoms include abdominal pain and back pain. Pertinent negatives include no constipation, diarrhea, dysuria, nausea or vomiting.   Renee Hudson is a 36 y.o. G2P1001 at 5563w2d who presents with vaginal bleeding. Current pregnancy complicated by placenta previa & fetal abnormalities (short long bones, EIF, & bilat pyelectasis). Had ultrasound with MFM this morning (transabdominally) that shows posterior previa. Denies any bleeding episodes this pregnancy. Received rhogham at 28 weeks.  Vaginal bleeding started around 11 am this morning. Noted bright red blood in her panty liner & could feel blood coming out into the toilet. When she came to MAU, states she didn't see any more blood on her pad or toilet paper. Reports irregular mild abdominal cramping. Rates pain 1/10. Denies exams, intercourse, heavy lifting, or n/v/d/constipation. Positive fetal movement.   OB History    Gravida Para Term Preterm AB Living   2 1 1     1    SAB TAB Ectopic Multiple Live Births           1      Past Medical History:  Diagnosis Date  . Hx of varicella     Past Surgical History:  Procedure Laterality Date  . CESAREAN SECTION N/A 04/22/2014   Performed by Mitchel HonourMorris, Megan, DO at [facility]  . WISDOM TOOTH EXTRACTION      Family History  Problem Relation Age of Onset  . Hypertension Mother   . Thyroid disease Mother   . Hypertension Father   . Hypertension Maternal Grandmother   . Hypertension Maternal Grandfather   . Cancer Maternal Grandfather        Lung  . Multiple sclerosis Maternal Grandfather   . Hypertension Paternal Grandmother   . Hypertension Paternal Grandfather   . Diabetes Paternal Uncle      Social History   Tobacco Use  . Smoking status: Never Smoker  . Smokeless tobacco: Never Used  Substance Use Topics  . Alcohol use: No  . Drug use: No    Allergies: No Known Allergies  Medications Prior to Admission  Medication Sig Dispense Refill Last Dose  . Prenatal Vit-Fe Fumarate-FA (MULTIVITAMIN-PRENATAL) 27-0.8 MG TABS tablet Take 1 tablet by mouth daily at 12 noon.   05/14/2017 at Unknown time  . pseudoephedrine (SUDAFED) 30 MG tablet Take 30 mg every 4 (four) hours as needed by mouth for congestion. Pt took two on 05/15/17   05/15/2017 at Unknown time  . sertraline (ZOLOFT) 25 MG tablet Take 25 mg 2 (two) times daily by mouth.   05/15/2017 at Unknown time    Review of Systems  Constitutional: Negative.   Gastrointestinal: Positive for abdominal pain. Negative for constipation, diarrhea, nausea and vomiting.  Genitourinary: Positive for vaginal bleeding. Negative for dysuria and vaginal discharge.  Musculoskeletal: Positive for back pain.   Physical Exam   Blood pressure (!) 97/51, pulse 93, temperature 98.5 F (36.9 C), resp. rate 18, weight 184 lb (83.5 kg), last menstrual period 10/08/2016, SpO2 97 %, unknown if currently breastfeeding.  Physical Exam  Nursing note and vitals reviewed. Constitutional: She is oriented to person, place, and time. She appears well-developed and well-nourished. No distress.  HENT:  Head: Normocephalic and atraumatic.  Eyes: Conjunctivae  are normal. Right eye exhibits no discharge. Left eye exhibits no discharge. No scleral icterus.  Neck: Normal range of motion.  Respiratory: Effort normal. No respiratory distress.  GI: Soft. There is no tenderness.  Genitourinary: There is bleeding (gente spec exam performed. ~3 cm dark red clot at cervical os. ) in the vagina.  Neurological: She is alert and oriented to person, place, and time.  Skin: Skin is warm and dry. She is not diaphoretic.  Psychiatric: She has a normal mood and affect.  Her behavior is normal. Judgment and thought content normal.    MAU Course  Procedures Results for orders placed or performed during the hospital encounter of 05/15/17 (from the past 24 hour(s))  CBC     Status: Abnormal   Collection Time: 05/15/17  2:16 PM  Result Value Ref Range   WBC 12.0 (H) 4.0 - 10.5 K/uL   RBC 3.62 (L) 3.87 - 5.11 MIL/uL   Hemoglobin 11.5 (L) 12.0 - 15.0 g/dL   HCT 16.133.3 (L) 09.636.0 - 04.546.0 %   MCV 92.0 78.0 - 100.0 fL   MCH 31.8 26.0 - 34.0 pg   MCHC 34.5 30.0 - 36.0 g/dL   RDW 40.912.7 81.111.5 - 91.415.5 %   Platelets 129 (L) 150 - 400 K/uL    MDM NST:  Baseline: 145 bpm, Variability: Good {> 6 bpm), Accelerations: Reactive, Decelerations: Absent and ctx every 3-6 mins & some UI B negative (rhogham at 28 wks) C/w Dr. Henderson Cloudomblin. Notified of exam & tracing/toco. Will plan to keep patient; he will discuss plan with neonatologist since NICU has high census.  Mag sulfate 4 gm bolus followed by 2 gm per hour BMZ   Assessment and Plan  A: 1. Placenta previa in third trimester   2. Vaginal bleeding in pregnancy, third trimester    P: Msg sulfate started & first BMZ given [facility] d/t high census in NICU Agree with above Patient denies painful uterine contractions. 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 D/W Dr Geoffery Spruceevanso, Neonatology, who states NICU is full and recommends transfer of patient D/W [facility]. He accepts transfer.  A/P: Posterior Placenta Previa             Bleeding today-currently stable and not bleeding         U/S C/W achondroplasia          Magnesium sulfate infusing         Betamethasone ordered         [facility]          D/W patient and husband-she states she understands and agrees  Roselle LocusJames E Brantlee Penn II 05/15/2017, 3:20 PM

## 2017-05-15 NOTE — Progress Notes (Signed)
Reviewed with patient risks of transfer All questions answered

## 2017-05-15 NOTE — MAU Provider Note (Signed)
History     CSN: [id number]  Arrival date and time: 05/15/17 1231   First Provider Initiated Contact with Patient 05/15/17 1329      Chief Complaint  Patient presents with  . Vaginal Bleeding   HPI Renee HarborLauren Hudson is a 36 y.o. G2P1001 at 2229w2d who presents with vaginal bleeding. Current pregnancy complicated by placenta previa & fetal abnormalities (short long bones, EIF, & bilat pyelectasis). Had ultrasound with MFM this morning (transabdominally) that shows posterior previa. Denies any bleeding episodes this pregnancy. Received rhogham at 28 weeks.  Vaginal bleeding started around 11 am this morning. Noted bright red blood in her panty liner & could feel blood coming out into the toilet. When she came to MAU, states she didn't see any more blood on her pad or toilet paper. Reports irregular mild abdominal cramping. Rates pain 1/10. Denies exams, intercourse, heavy lifting, or n/v/d/constipation. Positive fetal movement.   OB History    Gravida Para Term Preterm AB Living   2 1 1     1    SAB TAB Ectopic Multiple Live Births           1      Past Medical History:  Diagnosis Date  . Hx of varicella     Past Surgical History:  Procedure Laterality Date  . CESAREAN SECTION N/A 04/22/2014   Performed by Mitchel HonourMorris, Megan, DO [facility]  . WISDOM TOOTH EXTRACTION      Family History  Problem Relation Age of Onset  . Hypertension Mother   . Thyroid disease Mother   . Hypertension Father   . Hypertension Maternal Grandmother   . Hypertension Maternal Grandfather   . Cancer Maternal Grandfather        Lung  . Multiple sclerosis Maternal Grandfather   . Hypertension Paternal Grandmother   . Hypertension Paternal Grandfather   . Diabetes Paternal Uncle     Social History   Tobacco Use  . Smoking status: Never Smoker  . Smokeless tobacco: Never Used  Substance Use Topics  . Alcohol use: No  . Drug use: No    Allergies: No Known Allergies  Medications Prior to  Admission  Medication Sig Dispense Refill Last Dose  . Prenatal Vit-Fe Fumarate-FA (MULTIVITAMIN-PRENATAL) 27-0.8 MG TABS tablet Take 1 tablet by mouth daily at 12 noon.   05/14/2017 at Unknown time  . pseudoephedrine (SUDAFED) 30 MG tablet Take 30 mg every 4 (four) hours as needed by mouth for congestion. Pt took two on 05/15/17   05/15/2017 at Unknown time  . sertraline (ZOLOFT) 25 MG tablet Take 25 mg 2 (two) times daily by mouth.   05/15/2017 at Unknown time    Review of Systems  Constitutional: Negative.   Gastrointestinal: Positive for abdominal pain. Negative for constipation, diarrhea, nausea and vomiting.  Genitourinary: Positive for vaginal bleeding. Negative for dysuria and vaginal discharge.  Musculoskeletal: Positive for back pain.   Physical Exam   Blood pressure 133/69, pulse (!) 102, temperature 98.5 F (36.9 C), resp. rate 18, weight 184 lb (83.5 kg), last menstrual period 10/08/2016, SpO2 97 %, unknown if currently breastfeeding.  Physical Exam  Nursing note and vitals reviewed. Constitutional: She is oriented to person, place, and time. She appears well-developed and well-nourished. No distress.  HENT:  Head: Normocephalic and atraumatic.  Eyes: Conjunctivae are normal. Right eye exhibits no discharge. Left eye exhibits no discharge. No scleral icterus.  Neck: Normal range of motion.  Respiratory: Effort normal. No respiratory distress.  GI:  Soft. There is no tenderness.  Genitourinary: There is bleeding (gente spec exam performed. ~3 cm dark red clot at cervical os. ) in the vagina.  Neurological: She is alert and oriented to person, place, and time.  Skin: Skin is warm and dry. She is not diaphoretic.  Psychiatric: She has a normal mood and affect. Her behavior is normal. Judgment and thought content normal.    MAU Course  Procedures Results for orders placed or performed during the hospital encounter of 05/15/17 (from the past 24 hour(s))  CBC     Status:  Abnormal   Collection Time: 05/15/17  2:16 PM  Result Value Ref Range   WBC 12.0 (H) 4.0 - 10.5 K/uL   RBC 3.62 (L) 3.87 - 5.11 MIL/uL   Hemoglobin 11.5 (L) 12.0 - 15.0 g/dL   HCT 45.433.3 (L) 09.836.0 - 11.946.0 %   MCV 92.0 78.0 - 100.0 fL   MCH 31.8 26.0 - 34.0 pg   MCHC 34.5 30.0 - 36.0 g/dL   RDW 14.712.7 82.911.5 - 56.215.5 %   Platelets 129 (L) 150 - 400 K/uL    MDM NST:  Baseline: 145 bpm, Variability: Good {> 6 bpm), Accelerations: Reactive, Decelerations: Absent and ctx every 3-6 mins & some UI B negative (rhogham at 28 wks) C/w Dr. Henderson Cloudomblin. Notified of exam & tracing/toco. Will plan to keep patient; he will discuss plan with neonatologist since NICU has high census.  Mag sulfate 4 gm bolus followed by 2 gm per hour BMZ   Assessment and Plan  A: 1. Placenta previa in third trimester   2. Vaginal bleeding in pregnancy, third trimester    P: Msg sulfate started & first BMZ given [facility] d/t high census in NICU   Judeth Hudson Renee Hudson 05/15/2017, 1:46 PM

## 2017-05-15 NOTE — MAU Note (Signed)
bleeding started after US this morning at MFM.  Has complete previa. Has not had pain, now ? cramping

## 2017-05-19 ENCOUNTER — Telehealth (HOSPITAL_COMMUNITY): Payer: Self-pay | Admitting: MS"

## 2017-05-19 LAB — BPAM RBC
Blood Product Expiration Date: 201812182359
Blood Product Expiration Date: 201812182359
Unit Type and Rh: 9500
Unit Type and Rh: 9500

## 2017-05-19 LAB — TYPE AND SCREEN
ABO/RH(D): B NEG
Antibody Screen: POSITIVE
UNIT DIVISION: 0
Unit division: 0

## 2017-05-19 NOTE — Telephone Encounter (Signed)
Called Renee Hudson to discuss her prenatal cell free single gene DNA screening (Noninvasive prenatal testing) results. Mrs. Anner Baity and her husband had [facility] screening through [facility]. Screening was offered because of ultrasound findings suggestive of a skeletal dysplasia. The patient was identified by name and DOB. 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 Specifically, the pathogenic variant detected in the sample is c.1138G>A (p.G380R), which is associated with Achondroplasia. This variant was not detected in maternal or paternal DNA in the sample.  Screening was negative for the remaining genes on the NIPT panel.  The detection rates for these conditions vary depending upon the specific condition but range from 43% to 96%. Therefore, this screening does not identify all new dominant gene mutations and a residual risk remains.   We reviewed that this is not considered diagnostic. However, given the ultrasound findings in the pregnancy, sensitivity and specificity of this screen, the positive predictive value is estimated to be greater than 99%. Ms. Mckenny declined amniocentesis for confirmation and would prefer to pursue postnatal genetic confirmation, if indicated by their son's provider. We briefly discussed medical management of achondroplasia in the postnatal period and that AAP committee on Genetics has established medical guidelines published. Mrs. Eckersley planned to contact her pediatrician to discuss their comfort level for caring for patients with achondroplasia. We discussed that referrals for the couple's son post delivery to other specialists, such as medical genetics, would be facilitated by their primary pediatrician. Discussed the option of follow-up genetic counseling to discuss achondroplasia with the couple. Mrs. Didion will call our office if she would like to schedule follow up  genetic counseling. All questions were answered to her satisfaction, she was encouraged to call with additional questions or concerns.    Chipper Oman, MS Insurance risk surveyor

## 2017-05-25 DIAGNOSIS — O351XX0 Maternal care for (suspected) chromosomal abnormality in fetus, not applicable or unspecified: Secondary | ICD-10-CM | POA: Diagnosis not present

## 2017-05-25 DIAGNOSIS — Z3A32 32 weeks gestation of pregnancy: Secondary | ICD-10-CM | POA: Diagnosis not present

## 2017-06-01 DIAGNOSIS — Z3A33 33 weeks gestation of pregnancy: Secondary | ICD-10-CM | POA: Diagnosis not present

## 2017-06-01 DIAGNOSIS — O351XX0 Maternal care for (suspected) chromosomal abnormality in fetus, not applicable or unspecified: Secondary | ICD-10-CM | POA: Diagnosis not present

## 2017-06-04 ENCOUNTER — Encounter (HOSPITAL_COMMUNITY): Payer: Self-pay

## 2017-06-04 DIAGNOSIS — O351XX0 Maternal care for (suspected) chromosomal abnormality in fetus, not applicable or unspecified: Secondary | ICD-10-CM | POA: Diagnosis not present

## 2017-06-04 DIAGNOSIS — Z3A34 34 weeks gestation of pregnancy: Secondary | ICD-10-CM | POA: Diagnosis not present

## 2017-06-05 ENCOUNTER — Telehealth (HOSPITAL_COMMUNITY): Payer: Self-pay | Admitting: *Deleted

## 2017-06-05 ENCOUNTER — Encounter: Payer: Self-pay | Admitting: Neonatology

## 2017-06-05 DIAGNOSIS — O283 Abnormal ultrasonic finding on antenatal screening of mother: Secondary | ICD-10-CM | POA: Diagnosis not present

## 2017-06-05 NOTE — Telephone Encounter (Signed)
Preadmission screen  

## 2017-06-05 NOTE — Progress Notes (Signed)
Neonatology Consult to Antenatal Patient:  I was asked by MFM to see this patient in order to provide antenatal counseling due to known achondroplasia with plans to deliver at [redacted] weeks GA.  Ms. 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 She has placenta previa and will have a repeat C-section delivery on 12/20. This is the couple's second child. They have already obtained genetic counseling and have a good amount of information about this condition. Mrs. Renee Hudson has gotten Betamethasone.  I spoke with the patient and her husband. There is a possibility that the baby will not require NICU admission. A neonatologist will attend his delivery and, if he has no respiratory problems, may try routine mother-baby unit status. I made them aware that the baby could develop symptoms after a few hours that would necessitate transfer to the NICU. He would, of course, go directly to NICU if he has any respiratory irregularity or other medical problems that cannot be managed otherwise. We discussed likely LOS, feeding issues (mother may try to provide breast milk), possible use of NG feedings or IV supplementation; we also discussed possible respiratory complications related to preterm birth. We would probably perform a cranial ultrasound to evaluate for hydrocephalus in this infant. Almost all of the issues seen with achondroplasia become problems after the immediate newborn period, but we would make sure the family is connected to outpatient resources before discharge. This might occur through their pediatrician, Dr. Mayford KnifeWilliams.  I answered their questions and offered them a NICU tour if they would like to have one. This could not be accomplished today, so I gave them the phone number for NICU, and they can call to arrange this at their convenience.  Thank you for asking me to see this patient.  Doretha Souhristie C.  Gladies Sofranko, MD Neonatologist  The total length of face-to-face or floor/unit time for this encounter was 45 minutes. Counseling and/or coordination of care was 30 minutes of the above.

## 2017-06-08 ENCOUNTER — Telehealth (HOSPITAL_COMMUNITY): Payer: Self-pay | Admitting: *Deleted

## 2017-06-08 NOTE — Telephone Encounter (Signed)
Preadmission screen  

## 2017-06-09 ENCOUNTER — Encounter (HOSPITAL_COMMUNITY): Payer: Self-pay

## 2017-06-11 DIAGNOSIS — Z3A35 35 weeks gestation of pregnancy: Secondary | ICD-10-CM | POA: Diagnosis not present

## 2017-06-11 DIAGNOSIS — Z3480 Encounter for supervision of other normal pregnancy, unspecified trimester: Secondary | ICD-10-CM | POA: Diagnosis not present

## 2017-06-11 DIAGNOSIS — O9989 Other specified diseases and conditions complicating pregnancy, childbirth and the puerperium: Secondary | ICD-10-CM | POA: Diagnosis not present

## 2017-06-11 DIAGNOSIS — Z3685 Encounter for antenatal screening for Streptococcus B: Secondary | ICD-10-CM | POA: Diagnosis not present

## 2017-06-15 DIAGNOSIS — O351XX0 Maternal care for (suspected) chromosomal abnormality in fetus, not applicable or unspecified: Secondary | ICD-10-CM | POA: Diagnosis not present

## 2017-06-15 DIAGNOSIS — O3663X0 Maternal care for excessive fetal growth, third trimester, not applicable or unspecified: Secondary | ICD-10-CM | POA: Diagnosis not present

## 2017-06-15 DIAGNOSIS — Z3A35 35 weeks gestation of pregnancy: Secondary | ICD-10-CM | POA: Diagnosis not present

## 2017-06-15 DIAGNOSIS — O403XX0 Polyhydramnios, third trimester, not applicable or unspecified: Secondary | ICD-10-CM | POA: Diagnosis not present

## 2017-06-17 ENCOUNTER — Encounter (HOSPITAL_COMMUNITY)
Admission: RE | Admit: 2017-06-17 | Discharge: 2017-06-17 | Disposition: A | Discharge status: Home / Self Care | Payer: BLUE CROSS/BLUE SHIELD | Source: Ambulatory Visit | Attending: Obstetrics & Gynecology | Admitting: Obstetrics & Gynecology

## 2017-06-17 DIAGNOSIS — D649 Anemia, unspecified: Secondary | ICD-10-CM | POA: Diagnosis not present

## 2017-06-17 DIAGNOSIS — Z3A00 Weeks of gestation of pregnancy not specified: Secondary | ICD-10-CM | POA: Diagnosis not present

## 2017-06-17 DIAGNOSIS — O4403 Placenta previa specified as without hemorrhage, third trimester: Secondary | ICD-10-CM | POA: Diagnosis not present

## 2017-06-17 DIAGNOSIS — O34211 Maternal care for low transverse scar from previous cesarean delivery: Secondary | ICD-10-CM | POA: Diagnosis not present

## 2017-06-17 DIAGNOSIS — O4400 Placenta previa specified as without hemorrhage, unspecified trimester: Secondary | ICD-10-CM | POA: Diagnosis not present

## 2017-06-17 DIAGNOSIS — O358XX0 Maternal care for other (suspected) fetal abnormality and damage, not applicable or unspecified: Secondary | ICD-10-CM | POA: Diagnosis not present

## 2017-06-17 DIAGNOSIS — Z3A36 36 weeks gestation of pregnancy: Secondary | ICD-10-CM | POA: Diagnosis not present

## 2017-06-17 DIAGNOSIS — O34219 Maternal care for unspecified type scar from previous cesarean delivery: Secondary | ICD-10-CM | POA: Diagnosis not present

## 2017-06-17 DIAGNOSIS — E669 Obesity, unspecified: Secondary | ICD-10-CM | POA: Diagnosis not present

## 2017-06-17 DIAGNOSIS — O9902 Anemia complicating childbirth: Secondary | ICD-10-CM | POA: Diagnosis not present

## 2017-06-17 DIAGNOSIS — O99214 Obesity complicating childbirth: Secondary | ICD-10-CM | POA: Diagnosis not present

## 2017-06-17 HISTORY — DX: Other complications of anesthesia, initial encounter: T88.59XA

## 2017-06-17 HISTORY — DX: Adverse effect of unspecified anesthetic, initial encounter: T41.45XA

## 2017-06-17 LAB — CBC
HCT: 35.7 % — ABNORMAL LOW (ref 36.0–46.0)
Hemoglobin: 12 g/dL (ref 12.0–15.0)
MCH: 30.8 pg (ref 26.0–34.0)
MCHC: 33.6 g/dL (ref 30.0–36.0)
MCV: 91.5 fL (ref 78.0–100.0)
PLATELETS: 139 10*3/uL — AB (ref 150–400)
RBC: 3.9 MIL/uL (ref 3.87–5.11)
RDW: 12.9 % (ref 11.5–15.5)
WBC: 11.7 10*3/uL — AB (ref 4.0–10.5)

## 2017-06-17 NOTE — Patient Instructions (Signed)
Renee Hudson  06/17/2017   Your procedure is scheduled on:  06/18/2017  [facility] through [facility] at 1100 AM.  Pick up the phone at the desk and dial [phone]  Call this number if you have problems the morning of surgery:[phone]  Remember:   Do not eat food:After Midnight.  Do not drink clear liquids: After Midnight.  Take these medicines the morning of surgery with A SIP OF WATER: zoloft   Do not wear jewelry, make-up or nail polish.  Do not wear lotions, powders, or perfumes. Do not wear deodorant.  Do not shave 48 hours prior to surgery.  Do not bring valuables to the hospital.  [facility] is not   responsible for any belongings or valuables brought to the hospital.  Contacts, dentures or bridgework may not be worn into surgery.  Leave suitcase in the car. After surgery it may be brought to your room.  For patients admitted to the hospital, checkout time is 11:00 AM the day of              discharge.    N/A   Please read over the following fact sheets that you were given:   Surgical Site Infection Prevention

## 2017-06-17 NOTE — H&P (Signed)
Renee Hudson is a 36 y.o. female presenting for repeat C/S.  This pregnancy is complicated by placental previa; received BMZ 11/16 and 17.  Additionally, secondary to multiple soft markers detected on anatomy scan, the patient was referring to MFM.  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 U/S findings include short long bones, trident hands, frontal bossing, depressed nasal bridge, large head size, UTD A1 on right;   OB History    Gravida Para Term Preterm AB Living   _0 SAB TAB Ectopic Multiple Live Births           1     Past Medical History:  Diagnosis Date  . Complication of anesthesia    extreme sleepiness after delivery with last child  . Hx of varicella    Past Surgical History:  Procedure Laterality Date  . CESAREAN SECTION N/A 04/22/2014   Procedure: CESAREAN SECTION;  Surgeon: Linda Hedges, DO;  Location: [facility];  Service: Obstetrics;  Laterality: N/A;  . WISDOM TOOTH EXTRACTION     Family History: family history includes Anxiety disorder in her mother; Cancer in her maternal grandfather; Diabetes in her paternal uncle; Hypertension in her father, maternal grandfather, maternal grandmother, mother, paternal grandfather, and paternal grandmother; Thyroid disease in her mother. Social History:  reports that  has never smoked. she has never used smokeless tobacco. She reports that she does not drink alcohol or use drugs.     Maternal Diabetes: No Genetic Screening: Abnormal:  Results: Other:FGFR3 positive (likely achondroplasia) Maternal Ultrasounds/Referrals: Abnormal:  Findings:   Other:See HPI Fetal Ultrasounds or other Referrals:  Referred to Materal Fetal Medicine  Maternal Substance Abuse:  No Significant Maternal Medications:  None Significant Maternal Lab Results:  Lab values include: Group B Strep negative Other Comments:  None  ROS Maternal  Medical History:  Fetal activity: Perceived fetal activity is normal.    Prenatal complications: Placental abnormality.   Prenatal Complications - Diabetes: none.      Last menstrual period 10/08/2016, unknown if currently breastfeeding. Maternal Exam:  Uterine Assessment: Contraction strength is mild.  Contraction frequency is rare.   Abdomen: Patient reports no abdominal tenderness. Surgical scars: low transverse.   Fundal height is S>D.   Estimated fetal weight is 8#.       Physical Exam  Constitutional: She is oriented to person, place, and time. She appears well-developed and well-nourished.  GI: Soft. There is no rebound and no guarding.  Neurological: She is alert and oriented to person, place, and time.  Skin: Skin is warm and dry.  Psychiatric: She has a normal mood and affect. Her behavior is normal.    Prenatal labs: ABO, Rh: --/--/B NEG (12/19 1000) Antibody: POS (12/19 1000) Rubella: Immune (06/21 0000) RPR: Nonreactive (06/21 0000)  HBsAg: Negative (06/21 0000)  HIV: Non-reactive (06/21 0000)  GBS:     Assessment/Plan: 36yo G2P1001 with placenta previa, achondroplasia -Repeat C/S -Patient has been counseled re: risk of bleeding, infection, scarring, and damage to surrounding structures.  All questions were answered and the patient wishes to proceed.     Renee Hudson, [place] 06/17/2017, 9:12 PM

## 2017-06-18 ENCOUNTER — Encounter (HOSPITAL_COMMUNITY): Payer: Self-pay | Admitting: Obstetrics

## 2017-06-18 ENCOUNTER — Inpatient Hospital Stay (HOSPITAL_COMMUNITY)
Admission: RE | Admit: 2017-06-18 | Discharge: 2017-06-20 | DRG: 787 | Disposition: A | Discharge status: Home / Self Care | Payer: BLUE CROSS/BLUE SHIELD | Source: Ambulatory Visit | Attending: Obstetrics & Gynecology | Admitting: Obstetrics & Gynecology

## 2017-06-18 ENCOUNTER — Inpatient Hospital Stay (HOSPITAL_COMMUNITY): Payer: BLUE CROSS/BLUE SHIELD | Admitting: Anesthesiology

## 2017-06-18 ENCOUNTER — Encounter (HOSPITAL_COMMUNITY)
Admission: RE | Disposition: A | Discharge status: Home / Self Care | Payer: Self-pay | Source: Ambulatory Visit | Attending: Obstetrics & Gynecology

## 2017-06-18 DIAGNOSIS — Z3A36 36 weeks gestation of pregnancy: Secondary | ICD-10-CM

## 2017-06-18 DIAGNOSIS — O4403 Placenta previa specified as without hemorrhage, third trimester: Secondary | ICD-10-CM | POA: Diagnosis not present

## 2017-06-18 DIAGNOSIS — D649 Anemia, unspecified: Secondary | ICD-10-CM | POA: Diagnosis present

## 2017-06-18 DIAGNOSIS — O9902 Anemia complicating childbirth: Secondary | ICD-10-CM | POA: Diagnosis present

## 2017-06-18 DIAGNOSIS — O358XX0 Maternal care for other (suspected) fetal abnormality and damage, not applicable or unspecified: Secondary | ICD-10-CM | POA: Diagnosis present

## 2017-06-18 DIAGNOSIS — O99214 Obesity complicating childbirth: Secondary | ICD-10-CM | POA: Diagnosis present

## 2017-06-18 DIAGNOSIS — E669 Obesity, unspecified: Secondary | ICD-10-CM | POA: Diagnosis present

## 2017-06-18 DIAGNOSIS — O34211 Maternal care for low transverse scar from previous cesarean delivery: Principal | ICD-10-CM | POA: Diagnosis present

## 2017-06-18 DIAGNOSIS — Z3A00 Weeks of gestation of pregnancy not specified: Secondary | ICD-10-CM | POA: Diagnosis not present

## 2017-06-18 DIAGNOSIS — Z98891 History of uterine scar from previous surgery: Secondary | ICD-10-CM

## 2017-06-18 DIAGNOSIS — O4400 Placenta previa specified as without hemorrhage, unspecified trimester: Secondary | ICD-10-CM | POA: Diagnosis not present

## 2017-06-18 DIAGNOSIS — O34219 Maternal care for unspecified type scar from previous cesarean delivery: Secondary | ICD-10-CM | POA: Diagnosis not present

## 2017-06-18 LAB — RPR: RPR: NONREACTIVE

## 2017-06-18 SURGERY — Surgical Case
Anesthesia: Spinal | Site: Abdomen | Wound class: Clean Contaminated

## 2017-06-18 MED ORDER — NALOXONE HCL 0.4 MG/ML IJ SOLN: 1.0000 ug/kg/h | INTRAMUSCULAR | Status: DC | PRN

## 2017-06-18 MED ORDER — PHENYLEPHRINE 8 MG IN D5W 100 ML (0.08MG/ML) PREMIX OPTIME
INJECTION | INTRAVENOUS | Status: AC
  Filled 2017-06-18: qty 100

## 2017-06-18 MED ORDER — PHENYLEPHRINE 8 MG IN D5W 100 ML (0.08MG/ML) PREMIX OPTIME
INJECTION | INTRAVENOUS | Status: DC | PRN
  Administered 2017-06-18: 60 ug/min via INTRAVENOUS

## 2017-06-18 MED ORDER — SIMETHICONE 80 MG PO CHEW
80.0000 mg | CHEWABLE_TABLET | Freq: Three times a day (TID) | ORAL | Status: DC
  Administered 2017-06-19 – 2017-06-20 (×4): 80 mg via ORAL
  Filled 2017-06-18 (×4): qty 1

## 2017-06-18 MED ORDER — DEXAMETHASONE SODIUM PHOSPHATE 10 MG/ML IJ SOLN
INTRAMUSCULAR | Status: DC | PRN
  Administered 2017-06-18: 10 mg via INTRAVENOUS

## 2017-06-18 MED ORDER — NALOXONE HCL 0.4 MG/ML IJ SOLN: 0.4000 mg | INTRAMUSCULAR | Status: DC | PRN

## 2017-06-18 MED ORDER — NALBUPHINE HCL 10 MG/ML IJ SOLN: 5.0000 mg | Freq: Once | INTRAMUSCULAR | Status: DC | PRN

## 2017-06-18 MED ORDER — SIMETHICONE 80 MG PO CHEW
80.0000 mg | CHEWABLE_TABLET | ORAL | Status: DC
  Administered 2017-06-19 (×2): 80 mg via ORAL
  Filled 2017-06-18 (×2): qty 1

## 2017-06-18 MED ORDER — MORPHINE SULFATE (PF) 0.5 MG/ML IJ SOLN
INTRAMUSCULAR | Status: AC
  Filled 2017-06-18: qty 10

## 2017-06-18 MED ORDER — FENTANYL CITRATE (PF) 100 MCG/2ML IJ SOLN
INTRAMUSCULAR | Status: AC
  Filled 2017-06-18: qty 2

## 2017-06-18 MED ORDER — TETANUS-DIPHTH-ACELL PERTUSSIS 5-2.5-18.5 LF-MCG/0.5 IM SUSP: 0.5000 mL | Freq: Once | INTRAMUSCULAR | Status: DC

## 2017-06-18 MED ORDER — ZOLPIDEM TARTRATE 5 MG PO TABS: 5.0000 mg | ORAL_TABLET | Freq: Every evening | ORAL | Status: DC | PRN

## 2017-06-18 MED ORDER — OXYTOCIN 40 UNITS IN LACTATED RINGERS INFUSION - SIMPLE MED
2.5000 [IU]/h | INTRAVENOUS | Status: AC
Start: 2017-06-18 — End: 2017-06-19

## 2017-06-18 MED ORDER — NALBUPHINE HCL 10 MG/ML IJ SOLN: 5.0000 mg | INTRAMUSCULAR | Status: DC | PRN

## 2017-06-18 MED ORDER — DEXAMETHASONE SODIUM PHOSPHATE 10 MG/ML IJ SOLN
INTRAMUSCULAR | Status: AC
  Filled 2017-06-18: qty 1

## 2017-06-18 MED ORDER — IBUPROFEN 600 MG PO TABS
600.0000 mg | ORAL_TABLET | Freq: Four times a day (QID) | ORAL | Status: DC
  Administered 2017-06-19 – 2017-06-20 (×7): 600 mg via ORAL
  Filled 2017-06-18 (×7): qty 1

## 2017-06-18 MED ORDER — SODIUM CHLORIDE 0.9 % IR SOLN
Status: DC | PRN
  Administered 2017-06-18: 1000 mL

## 2017-06-18 MED ORDER — SCOPOLAMINE 1 MG/3DAYS TD PT72: 1.0000 | MEDICATED_PATCH | Freq: Once | TRANSDERMAL | Status: DC

## 2017-06-18 MED ORDER — PROMETHAZINE HCL 25 MG/ML IJ SOLN: 6.2500 mg | INTRAMUSCULAR | Status: DC | PRN

## 2017-06-18 MED ORDER — DIPHENHYDRAMINE HCL 25 MG PO CAPS: 25.0000 mg | ORAL_CAPSULE | ORAL | Status: DC | PRN

## 2017-06-18 MED ORDER — SODIUM CHLORIDE 0.9% FLUSH: 3.0000 mL | INTRAVENOUS | Status: DC | PRN

## 2017-06-18 MED ORDER — WITCH HAZEL-GLYCERIN EX PADS: 1.0000 "application " | MEDICATED_PAD | CUTANEOUS | Status: DC | PRN

## 2017-06-18 MED ORDER — COCONUT OIL OIL: 1.0000 "application " | TOPICAL_OIL | Status: DC | PRN

## 2017-06-18 MED ORDER — ACETAMINOPHEN 500 MG PO TABS
1000.0000 mg | ORAL_TABLET | Freq: Four times a day (QID) | ORAL | Status: AC
  Filled 2017-06-18: qty 2

## 2017-06-18 MED ORDER — ONDANSETRON HCL 4 MG/2ML IJ SOLN: 4.0000 mg | Freq: Three times a day (TID) | INTRAMUSCULAR | Status: DC | PRN

## 2017-06-18 MED ORDER — KETOROLAC TROMETHAMINE 30 MG/ML IJ SOLN: 30.0000 mg | Freq: Four times a day (QID) | INTRAMUSCULAR | Status: AC | PRN

## 2017-06-18 MED ORDER — FAMOTIDINE IN NACL 20-0.9 MG/50ML-% IV SOLN
20.0000 mg | Freq: Once | INTRAVENOUS | Status: AC
  Administered 2017-06-18: 20 mg via INTRAVENOUS
  Filled 2017-06-18: qty 50

## 2017-06-18 MED ORDER — MENTHOL 3 MG MT LOZG: 1.0000 | LOZENGE | OROMUCOSAL | Status: DC | PRN

## 2017-06-18 MED ORDER — SOD CITRATE-CITRIC ACID 500-334 MG/5ML PO SOLN
30.0000 mL | Freq: Once | ORAL | Status: AC
  Administered 2017-06-18: 30 mL via ORAL
  Filled 2017-06-18: qty 15

## 2017-06-18 MED ORDER — ACETAMINOPHEN 325 MG PO TABS: 650.0000 mg | ORAL_TABLET | ORAL | Status: DC | PRN

## 2017-06-18 MED ORDER — DIPHENHYDRAMINE HCL 50 MG/ML IJ SOLN
INTRAMUSCULAR | Status: DC | PRN
  Administered 2017-06-18: 25 mg via INTRAVENOUS

## 2017-06-18 MED ORDER — SIMETHICONE 80 MG PO CHEW: 80.0000 mg | CHEWABLE_TABLET | ORAL | Status: DC | PRN

## 2017-06-18 MED ORDER — PRENATAL MULTIVITAMIN CH
1.0000 | ORAL_TABLET | Freq: Every day | ORAL | Status: DC
  Administered 2017-06-19 – 2017-06-20 (×2): 1 via ORAL
  Filled 2017-06-18 (×2): qty 1

## 2017-06-18 MED ORDER — DIPHENHYDRAMINE HCL 50 MG/ML IJ SOLN: 12.5000 mg | INTRAMUSCULAR | Status: DC | PRN

## 2017-06-18 MED ORDER — FENTANYL CITRATE (PF) 100 MCG/2ML IJ SOLN
INTRAMUSCULAR | Status: DC | PRN
  Administered 2017-06-18: 10 ug via INTRATHECAL

## 2017-06-18 MED ORDER — LACTATED RINGERS IV SOLN
INTRAVENOUS | Status: DC
  Administered 2017-06-18: 125 mL/h via INTRAVENOUS

## 2017-06-18 MED ORDER — DIPHENHYDRAMINE HCL 50 MG/ML IJ SOLN
INTRAMUSCULAR | Status: AC
  Filled 2017-06-18: qty 1

## 2017-06-18 MED ORDER — OXYTOCIN 10 UNIT/ML IJ SOLN
INTRAMUSCULAR | Status: AC
  Filled 2017-06-18: qty 4

## 2017-06-18 MED ORDER — OXYTOCIN 10 UNIT/ML IJ SOLN
INTRAVENOUS | Status: DC | PRN
  Administered 2017-06-18: 40 [IU] via INTRAVENOUS

## 2017-06-18 MED ORDER — OXYCODONE-ACETAMINOPHEN 5-325 MG PO TABS
2.0000 | ORAL_TABLET | ORAL | Status: DC | PRN
  Administered 2017-06-18 – 2017-06-20 (×8): 2 via ORAL
  Filled 2017-06-18 (×8): qty 2

## 2017-06-18 MED ORDER — CEFAZOLIN SODIUM-DEXTROSE 2-4 GM/100ML-% IV SOLN
2.0000 g | INTRAVENOUS | Status: AC
  Administered 2017-06-18: 2 g via INTRAVENOUS
  Filled 2017-06-18: qty 100

## 2017-06-18 MED ORDER — SERTRALINE HCL 25 MG PO TABS
25.0000 mg | ORAL_TABLET | Freq: Two times a day (BID) | ORAL | Status: DC
  Administered 2017-06-18 – 2017-06-20 (×3): 25 mg via ORAL
  Filled 2017-06-18 (×4): qty 1

## 2017-06-18 MED ORDER — OXYCODONE-ACETAMINOPHEN 5-325 MG PO TABS
1.0000 | ORAL_TABLET | ORAL | Status: DC | PRN
  Administered 2017-06-19: 1 via ORAL
  Filled 2017-06-18: qty 1

## 2017-06-18 MED ORDER — LACTATED RINGERS IV SOLN
INTRAVENOUS | Status: DC
  Administered 2017-06-18 (×3): via INTRAVENOUS

## 2017-06-18 MED ORDER — DIPHENHYDRAMINE HCL 25 MG PO CAPS: 25.0000 mg | ORAL_CAPSULE | Freq: Four times a day (QID) | ORAL | Status: DC | PRN

## 2017-06-18 MED ORDER — MEPERIDINE HCL 25 MG/ML IJ SOLN: 6.2500 mg | INTRAMUSCULAR | Status: DC | PRN

## 2017-06-18 MED ORDER — BUPIVACAINE IN DEXTROSE 0.75-8.25 % IT SOLN
INTRATHECAL | Status: DC | PRN
  Administered 2017-06-18: 1.6 mL via INTRATHECAL

## 2017-06-18 MED ORDER — SENNOSIDES-DOCUSATE SODIUM 8.6-50 MG PO TABS
2.0000 | ORAL_TABLET | ORAL | Status: DC
  Administered 2017-06-19 (×2): 2 via ORAL
  Filled 2017-06-18 (×2): qty 2

## 2017-06-18 MED ORDER — ONDANSETRON HCL 4 MG/2ML IJ SOLN
INTRAMUSCULAR | Status: DC | PRN
  Administered 2017-06-18: 4 mg via INTRAVENOUS

## 2017-06-18 MED ORDER — MORPHINE SULFATE (PF) 0.5 MG/ML IJ SOLN
INTRAMUSCULAR | Status: DC | PRN
  Administered 2017-06-18: .2 mg via INTRATHECAL

## 2017-06-18 MED ORDER — FENTANYL CITRATE (PF) 100 MCG/2ML IJ SOLN
25.0000 ug | INTRAMUSCULAR | Status: DC | PRN
Start: 2017-06-18 — End: 2017-06-18
  Administered 2017-06-18: 25 ug via INTRAVENOUS
  Administered 2017-06-18: 50 ug via INTRAVENOUS

## 2017-06-18 MED ORDER — ONDANSETRON HCL 4 MG/2ML IJ SOLN
INTRAMUSCULAR | Status: AC
  Filled 2017-06-18: qty 2

## 2017-06-18 MED ORDER — SCOPOLAMINE 1 MG/3DAYS TD PT72
MEDICATED_PATCH | TRANSDERMAL | Status: DC | PRN
  Administered 2017-06-18: 1 via TRANSDERMAL

## 2017-06-18 MED ORDER — KETOROLAC TROMETHAMINE 30 MG/ML IJ SOLN
30.0000 mg | Freq: Four times a day (QID) | INTRAMUSCULAR | Status: AC | PRN
Start: 2017-06-18 — End: 2017-06-19

## 2017-06-18 MED ORDER — BUPIVACAINE IN DEXTROSE 0.75-8.25 % IT SOLN
INTRATHECAL | Status: AC
  Filled 2017-06-18: qty 2

## 2017-06-18 MED ORDER — DIBUCAINE 1 % RE OINT: 1.0000 "application " | TOPICAL_OINTMENT | RECTAL | Status: DC | PRN

## 2017-06-18 MED ORDER — LACTATED RINGERS IV SOLN
INTRAVENOUS | Status: DC | PRN
  Administered 2017-06-18: 13:00:00 via INTRAVENOUS

## 2017-06-18 SURGICAL SUPPLY — 33 items
BENZOIN TINCTURE PRP APPL 2/3 (GAUZE/BANDAGES/DRESSINGS) ×3 IMPLANT
CHLORAPREP W/TINT 26ML (MISCELLANEOUS) ×3 IMPLANT
CLAMP CORD UMBIL (MISCELLANEOUS) IMPLANT
CLOSURE STERI STRIP 1/2 X4 (GAUZE/BANDAGES/DRESSINGS) ×2 IMPLANT
CLOSURE WOUND 1/2 X4 (GAUZE/BANDAGES/DRESSINGS) ×1
CLOTH BEACON ORANGE TIMEOUT ST (SAFETY) ×3 IMPLANT
DERMABOND ADVANCED (GAUZE/BANDAGES/DRESSINGS)
DERMABOND ADVANCED .7 DNX12 (GAUZE/BANDAGES/DRESSINGS) IMPLANT
DRSG OPSITE POSTOP 4X10 (GAUZE/BANDAGES/DRESSINGS) ×3 IMPLANT
ELECT REM PT RETURN 9FT ADLT (ELECTROSURGICAL) ×3
ELECTRODE REM PT RTRN 9FT ADLT (ELECTROSURGICAL) ×1 IMPLANT
EXTRACTOR VACUUM KIWI (MISCELLANEOUS) IMPLANT
GLOVE BIO SURGEON STRL SZ 6 (GLOVE) ×3 IMPLANT
GLOVE BIOGEL PI IND STRL 6 (GLOVE) ×2 IMPLANT
GLOVE BIOGEL PI IND STRL 7.0 (GLOVE) ×1 IMPLANT
GLOVE BIOGEL PI INDICATOR 6 (GLOVE) ×4
GLOVE BIOGEL PI INDICATOR 7.0 (GLOVE) ×2
GOWN STRL REUS W/TWL LRG LVL3 (GOWN DISPOSABLE) ×6 IMPLANT
KIT ABG SYR 3ML LUER SLIP (SYRINGE) ×3 IMPLANT
NEEDLE HYPO 25X5/8 SAFETYGLIDE (NEEDLE) ×3 IMPLANT
NS IRRIG 1000ML POUR BTL (IV SOLUTION) ×3 IMPLANT
PACK C SECTION WH (CUSTOM PROCEDURE TRAY) ×3 IMPLANT
PAD OB MATERNITY 4.3X12.25 (PERSONAL CARE ITEMS) ×3 IMPLANT
PENCIL SMOKE EVAC W/HOLSTER (ELECTROSURGICAL) ×3 IMPLANT
STRIP CLOSURE SKIN 1/2X4 (GAUZE/BANDAGES/DRESSINGS) ×2 IMPLANT
SUT CHROMIC 0 CTX 36 (SUTURE) ×9 IMPLANT
SUT MON AB 2-0 CT1 27 (SUTURE) ×3 IMPLANT
SUT PDS AB 0 CT1 27 (SUTURE) IMPLANT
SUT PLAIN 0 NONE (SUTURE) IMPLANT
SUT VIC AB 0 CT1 36 (SUTURE) IMPLANT
SUT VIC AB 4-0 KS 27 (SUTURE) IMPLANT
TOWEL OR 17X24 6PK STRL BLUE (TOWEL DISPOSABLE) ×3 IMPLANT
TRAY FOLEY BAG SILVER LF 14FR (SET/KITS/TRAYS/PACK) IMPLANT

## 2017-06-18 NOTE — Transfer of Care (Signed)
Immediate Anesthesia Transfer of Care Note  Patient: Renee Hudson  Procedure(s) Performed: REPEAT CESAREAN SECTION (N/A Abdomen)  Patient Location: PACU  Anesthesia Type:Spinal  Level of Consciousness: awake, alert  and oriented  Airway & Oxygen Therapy: Patient Spontanous Breathing  Post-op Assessment: Report given to RN and Post -op Vital signs reviewed and stable  Post vital signs: Reviewed and stable  Last Vitals:  Vitals:   06/18/17 1134  BP: 129/75  Pulse: 83  Resp: 18  Temp: 37 C    Last Pain:  Vitals:   06/18/17 1134  TempSrc: Oral         Complications: No apparent anesthesia complications

## 2017-06-18 NOTE — Anesthesia Procedure Notes (Signed)
Spinal  Patient location during procedure: OR Start time: 06/18/2017 12:56 PM End time: 06/18/2017 12:59 PM Staffing Anesthesiologist: Beryle LatheBrock, Lakayla Barrington E, MD Performed: anesthesiologist  Preanesthetic Checklist Completed: patient identified, surgical consent, pre-op evaluation, timeout performed, IV checked, risks and benefits discussed and monitors and equipment checked Spinal Block Patient position: sitting Prep: DuraPrep Patient monitoring: heart rate, cardiac monitor, continuous pulse ox and blood pressure Approach: midline Location: L3-4 Injection technique: single-shot Needle Needle type: Pencan  Needle gauge: 24 G Additional Notes Functioning IV was confirmed and monitors were applied. Sterile prep and drape, including hand hygiene, mask, and sterile gloves were used. The patient was positioned and the spine was prepped. The skin was anesthetized with lidocaine. Free flow of clear CSF was obtained prior to injecting local anesthetic into the CSF. The spinal needle aspirated freely following injection. The needle was carefully withdrawn. The patient tolerated the procedure well. Consent was obtained prior to the procedure with all questions answered and concerns addressed. Risks including, but not limited to, bleeding, infection, nerve damage, paralysis, failed block, inadequate analgesia, allergic reaction, high spinal, itching, and headache were discussed and the patient wished to proceed.  Leslye Peerhomas Garnette Greb, MD

## 2017-06-18 NOTE — Addendum Note (Signed)
Addendum  created 06/18/17 1753 by Shanon PayorGregory, Elijio Staples M, CRNA   Sign clinical note

## 2017-06-18 NOTE — Op Note (Signed)
Renee Hudson PROCEDURE DATE: 06/18/2017  PREOPERATIVE DIAGNOSIS: Intrauterine pregnancy at  8150w1d weeks gestation, placenta previa, previous C/S x 1, fetal achondroplasia  POSTOPERATIVE DIAGNOSIS: The same  PROCEDURE: Repeat Low Transverse Cesarean Section  SURGEON:  Dr. Mitchel HonourMegan Lauro Manlove  INDICATIONS: Renee BladeLauren T Hudson is a 36 y.o. G2P1001 at 2750w1d scheduled for cesarean section secondary to placenta previa.  The risks of cesarean section discussed with the patient included but were not limited to: bleeding which may require transfusion or reoperation; infection which may require antibiotics; injury to bowel, bladder, ureters or other surrounding organs; injury to the fetus; need for additional procedures including hysterectomy in the event of a life-threatening hemorrhage; placental abnormalities wth subsequent pregnancies, incisional problems, thromboembolic phenomenon and other postoperative/anesthesia complications. The patient concurred with the proposed plan, giving informed written consent for the procedure.    FINDINGS:  Viable female infant in cephalic presentation, APGARs 8,9:  Weight pending  clear amniotic fluid.  Intact placenta, three vessel cord.  Grossly normal uterus, ovaries and fallopian tubes. .   ANESTHESIA:  Spinal ESTIMATED BLOOD LOSS: 800 ml SPECIMENS: Placenta sent to pathology COMPLICATIONS: None immediate  PROCEDURE IN DETAIL:  The patient received intravenous antibiotics and had sequential compression devices applied to her lower extremities while in the preoperative area.  She was then taken to the operating room where spinal anesthesia was administered and was found to be adequate. She was then placed in a dorsal supine position with a leftward tilt, and prepped and draped in a sterile manner.  A foley catheter was placed into her bladder and attached to constant gravity.  After an adequate timeout was performed, a Pfannenstiel skin incision was made with scalpel and  carried through to the underlying layer of fascia. The fascia was incised in the midline and this incision was extended bilaterally using the Mayo scissors. Kocher clamps were applied to the superior aspect of the fascial incision and the underlying rectus muscles were dissected off bluntly. A similar process was carried out on the inferior aspect of the facial incision. The rectus muscles were separated in the midline bluntly and the peritoneum was entered bluntly.  Bladder flap was created sharply and developed bluntly.  Bladder blade was placed.  A transverse hysterotomy was made with a scalpel and extended bilaterally bluntly. The bladder blade was then removed. The infant was successfully delivered, and cord was clamped and cut and infant was handed over to awaiting neonatology team. Uterine massage was then administered and the placenta delivered intact with three-vessel cord. The uterus was cleared of clot and debris.  The hysterotomy was closed with 0 chromic.  A second imbricating suture of 0-chromic was used to reinforce the incision and aid in hemostasis.  The peritoneum and rectus muscles were noted to be hemostatic and were reapproximated using 3-0 monocryl in a running fashion.  The fascia was closed with 0-PDS in a running fashion with good restoration of anatomy.  The subcutaneus tissue was copiously irrigated.  The skin was closed with 4-0 vicryl in a subcuticular fashion.  Pt tolerated the procedure will.  All counts were correct x2.  Pt went to the recovery room in stable condition.

## 2017-06-18 NOTE — Anesthesia Preprocedure Evaluation (Addendum)
Anesthesia Evaluation  Patient identified by MRN, date of birth, ID band Patient awake    Reviewed: Allergy & Precautions, H&P , NPO status , Patient's Chart, lab work & pertinent test results  Airway Mallampati: I  TM Distance: >3 FB Neck ROM: Full    Dental no notable dental hx. (+) Teeth Intact   Pulmonary neg pulmonary ROS,    breath sounds clear to auscultation       Cardiovascular negative cardio ROS   Rhythm:Regular Rate:Normal     Neuro/Psych negative neurological ROS  negative psych ROS   GI/Hepatic negative GI ROS, Neg liver ROS,   Endo/Other  Obesity  Renal/GU negative Renal ROS  negative genitourinary   Musculoskeletal negative musculoskeletal ROS (+)   Abdominal (+) + obese,   Peds  Hematology  (+) anemia , Mild thrombocytopenia   Anesthesia Other Findings   Reproductive/Obstetrics (+) Pregnancy                            Anesthesia Physical  Anesthesia Plan  ASA: II  Anesthesia Plan: Spinal   Post-op Pain Management:    Induction:   PONV Risk Score and Plan: Treatment may vary due to age or medical condition  Airway Management Planned: Natural Airway  Additional Equipment: None  Intra-op Plan:   Post-operative Plan:   Informed Consent: I have reviewed the patients History and Physical, chart, labs and discussed the procedure including the risks, benefits and alternatives for the proposed anesthesia with the patient or authorized representative who has indicated his/her understanding and acceptance.   Dental advisory given  Plan Discussed with: CRNA  Anesthesia Plan Comments:         Anesthesia Quick Evaluation

## 2017-06-18 NOTE — Progress Notes (Signed)
Dr Mal AmabileBrock notified that pt's pain level is a 7.  Patient requesting percocet to be given  Orders received

## 2017-06-18 NOTE — Anesthesia Postprocedure Evaluation (Signed)
Anesthesia Post Note  Patient: Renee Hudson  Procedure(s) Performed: REPEAT CESAREAN SECTION (N/A Abdomen)     Patient location during evaluation: PACU Anesthesia Type: Spinal Level of consciousness: awake and alert Pain management: pain level controlled Vital Signs Assessment: post-procedure vital signs reviewed and stable Respiratory status: spontaneous breathing and respiratory function stable Cardiovascular status: blood pressure returned to baseline and stable Postop Assessment: spinal receding and no apparent nausea or vomiting Anesthetic complications: no    Last Vitals:  Vitals:   06/18/17 1515 06/18/17 1525  BP: 109/65 (!) 101/45  Pulse: 68 70  Resp: 17 18  Temp:  36.8 C  SpO2: 99% 96%     Pain Goal:                 Beryle Lathehomas E Rad Gramling

## 2017-06-18 NOTE — Progress Notes (Signed)
No change to H&P.  Darvell Monteforte, DO 

## 2017-06-18 NOTE — Anesthesia Postprocedure Evaluation (Signed)
Anesthesia Post Note  Patient: Renee Hudson  Procedure(s) Performed: REPEAT CESAREAN SECTION (N/A Abdomen)     Patient location during evaluation: Mother Baby Anesthesia Type: Spinal Level of consciousness: awake and alert and oriented Pain management: satisfactory to patient Vital Signs Assessment: post-procedure vital signs reviewed and stable Respiratory status: spontaneous breathing and nonlabored ventilation Cardiovascular status: stable Postop Assessment: no headache, no backache, patient able to bend at knees, no signs of nausea or vomiting and adequate PO intake Anesthetic complications: no    Last Vitals:  Vitals:   06/18/17 1640 06/18/17 1738  BP: (!) 106/53 (!) 105/49  Pulse: 66 (!) 57  Resp: 18 18  Temp: 37.1 C 36.7 C  SpO2: 95% 97%    Last Pain:  Vitals:   06/18/17 1743  TempSrc:   PainSc: 7    Pain Goal:                 Teal Bontrager

## 2017-06-19 LAB — CBC
HCT: 27.9 % — ABNORMAL LOW (ref 36.0–46.0)
Hemoglobin: 9.7 g/dL — ABNORMAL LOW (ref 12.0–15.0)
MCH: 31.1 pg (ref 26.0–34.0)
MCHC: 34.8 g/dL (ref 30.0–36.0)
MCV: 89.4 fL (ref 78.0–100.0)
Platelets: 176 10*3/uL (ref 150–400)
RBC: 3.12 MIL/uL — ABNORMAL LOW (ref 3.87–5.11)
RDW: 12.9 % (ref 11.5–15.5)
WBC: 21.1 10*3/uL — ABNORMAL HIGH (ref 4.0–10.5)

## 2017-06-19 MED ORDER — RHO D IMMUNE GLOBULIN 1500 UNIT/2ML IJ SOSY
300.0000 ug | PREFILLED_SYRINGE | Freq: Once | INTRAMUSCULAR | Status: AC
  Administered 2017-06-19: 300 ug via INTRAMUSCULAR
  Filled 2017-06-19: qty 2

## 2017-06-19 NOTE — Progress Notes (Signed)
Subjective: Postpartum Day 1: Cesarean Delivery Patient reports tolerating PO and + flatus.    Objective: Vital signs in last 24 hours: Temp:  [97.9 F (36.6 C)-98.7 F (37.1 C)] 98 F (36.7 C) (12/21 0520) Pulse Rate:  [57-83] 57 (12/21 0520) Resp:  [13-22] 18 (12/21 0520) BP: (95-129)/(41-75) 95/41 (12/21 0520) SpO2:  [94 %-100 %] 98 % (12/21 0520) Weight:  [86.2 kg (190 lb)] 86.2 kg (190 lb) (12/20 1134)  Physical Exam:  General: alert, cooperative and appears stated age 34Lochia: appropriate Uterine Fundus: firm Incision: healing well, no significant drainage, no dehiscence, no significant erythema DVT Evaluation: No evidence of DVT seen on physical exam. Negative Homan's sign. No cords or calf tenderness. No significant calf/ankle edema.  Recent Labs    06/17/17 1000 06/19/17 0551  HGB 12.0 9.7*  HCT 35.7* 27.9*    Assessment/Plan: Status post Cesarean section. Doing well postoperatively.  Continue current care. Circ today. Risks discussed and patient and husband consent.    Ranae Pilalise Jennifer Lamoyne Hessel 06/19/2017, 8:34 AM

## 2017-06-19 NOTE — Lactation Note (Signed)
This note was copied from a baby's chart. Lactation Consultation Note Mom's 2nd child. 1st child 523 yrs old. BF for 1 month. Mom had to supplement d/t inadequate BM supply, mom pumped and bottle fed her BM for 6 months. RN had set up DEBP. Discuss importance of supply and demand, power pumping, other options of increasing milk supply if needed.  Mom tried to BF before LC came to rm. Baby sound a sleep. Mom stated had a spit up. Noted distended abd.  Newborn feeding habits, behavior, STS, I&O, supply and demand discussed. Mom encouraged to feed baby 8-12 times/24 hours and with feeding cues.  Mom wore NS with her first child. Mom has flat compressible small nipples. Fitted w/#16 NS. Encouraged to wear shells. Mom requested NS.  Encouraged to call for assistance in latching, wake every 3 hrs if hasn't cued.  [facility]/LC brochure given w/resources, support groups and LC services.  Patient Name: Renee Mikki HarborLauren Sudano Today's Date: 06/19/2017 Reason for consult: Initial assessment   Maternal Data Has patient been taught Hand Expression?: Yes Does the patient have breastfeeding experience prior to this delivery?: Yes  Feeding Feeding Type: Formula Length of feed: 3 min  LATCH Score Latch: Too sleepy or reluctant, no latch achieved, no sucking elicited.  Audible Swallowing: None  Type of Nipple: Flat           Interventions Interventions: Breast feeding basics reviewed;Breast compression;Skin to skin;Support pillows;DEBP;Breast massage;Position options;Hand express;Pre-pump if needed;Shells  Lactation Tools Discussed/Used Tools: Shells;Pump;Nipple Shields Nipple shield size: 16 Shell Type: Inverted Breast pump type: Double-Electric Breast Pump Pump Review: Setup, frequency, and cleaning;Milk Storage Initiated by:: RN Date initiated:: 06/18/17   Consult Status Consult Status: Follow-up Date: 06/19/17 Follow-up type: In-patient    Charyl DancerCARVER, Valora Norell G 06/19/2017, 1:49 AM

## 2017-06-19 NOTE — Progress Notes (Signed)
MOB was referred for history of anxiety.  Referral is screened out by Clinical Social Worker because none of the following criteria appear to apply and  there are no reports impacting the pregnancy or her transition to the postpartum period. CSW does not deem it clinically necessary to further investigate at this time.  -History of anxiety/depression during this pregnancy, or of post-partum depression.  - Diagnosis of anxiety and/or depression within last 3 years.-  - History of depression due to pregnancy loss/loss of child or -MOB's symptoms are currently being treated with medication and/or therapy.  Please contact the Clinical Social Worker if needs arise or upon MOB request.    Jaeley Wiker LCSW, MSW Clinical Social Work: [facility]       

## 2017-06-19 NOTE — Lactation Note (Signed)
This note was copied from a baby's chart. Lactation Consultation Note  Patient Name: Renee Hudson Today's Date: 06/19/2017  Mom reports baby is not showing interest in latching even with the nipple shield.  She is pumping but not obtaining colostrum so baby is receiving formula.  Mom plans on an outpatient appointment after milk comes in for latch assist.   Maternal Data    Feeding Feeding Type: Formula  LATCH Score                   Interventions    Lactation Tools Discussed/Used     Consult Status      Huston FoleyMOULDEN, [name] 06/19/2017, 9:50 PM

## 2017-06-20 LAB — RH IG WORKUP (INCLUDES ABO/RH)
ABO/RH(D): B NEG
FETAL SCREEN: NEGATIVE
Gestational Age(Wks): 36.1
UNIT DIVISION: 0

## 2017-06-20 MED ORDER — OXYCODONE HCL 5 MG PO TABS: 5.0000 mg | ORAL_TABLET | ORAL | 0 refills | Status: AC | PRN

## 2017-06-20 NOTE — Lactation Note (Signed)
This note was copied from a baby's chart. Lactation Consultation Note  Patient Name: Renee Hudson [id number] Date: 06/20/2017 Reason for consult: Follow-up assessment;Late-preterm 34-36.6wks;Other (Comment)(lactation induction/ per mom probabaly plan to just pump and bottle feed . baby in NICU having a car seat check for D/C )  Per  Mom probably will pump and bottle feed for a few months. LC reviewed supply and demand the importance of pumping at least 8 times a day. Mom denies soreness, sore nipple and engorgement prevention and tx reviewed.  Per mom will have a DEBP Medela at home. LC recommended taking the DEBP kit with her home.  Mother informed of post-discharge support and given phone number to the lactation department, including services for phone call assistance; out-patient appointments; and breastfeeding support group. List of other breastfeeding resources in the community given in the handout. Encouraged mother to call for problems or concerns related to breastfeeding.    Maternal Data    Feeding Feeding Type: Formula Nipple Type: Slow - flow  LATCH Score                   Interventions Interventions: Breast feeding basics reviewed  Lactation Tools Discussed/Used Tools: Pump Breast pump type: Double-Electric Breast Pump WIC Program: No   Consult Status Consult Status: Complete Date: 06/20/17    Myer Haff 06/20/2017, 10:21 AM

## 2017-06-20 NOTE — Discharge Summary (Signed)
Obstetric Discharge Summary Reason for Admission: cesarean section Prenatal Procedures: none Intrapartum Procedures: cesarean: low cervical, transverse Postpartum Procedures: none Complications-Operative and Postpartum: none Hemoglobin  Date Value Ref Range Status  06/19/2017 9.7 (L) 12.0 - 15.0 g/dL Final   HCT  Date Value Ref Range Status  06/19/2017 27.9 (L) 36.0 - 46.0 % Final    Physical Exam:  General: alert, cooperative and appears stated age 71Lochia: appropriate Uterine Fundus: firm Incision: healing well, no significant drainage, no dehiscence, no significant erythema DVT Evaluation: No evidence of DVT seen on physical exam. Negative Homan's sign. No cords or calf tenderness. No significant calf/ankle edema.  Discharge Diagnoses: delivered via CS s/s placenta previa and rCS  Discharge Information: Date: 06/20/2017 Activity: pelvic rest Diet: routine Medications: Colace and oxycodone Condition: stable Instructions: refer to practice specific booklet Discharge to: home   Newborn Data: Live born female  Birth Weight: 7 lb 0.7 oz (3195 g) APGAR: 8, 9  Newborn Delivery   Birth date/time:  06/18/2017 13:19:00 Delivery type:  C-Section, Low Transverse C-section categorization:  Repeat     Home with mother.  Ranae Pilalise Jennifer Phillip Sandler 06/20/2017, 8:51 AM

## 2017-06-21 LAB — TYPE AND SCREEN
ABO/RH(D): B NEG
Antibody Screen: POSITIVE
UNIT DIVISION: 0
Unit division: 0

## 2017-06-21 LAB — BPAM RBC
BLOOD PRODUCT EXPIRATION DATE: 201901152359
Blood Product Expiration Date: 201901192359
UNIT TYPE AND RH: 9500
UNIT TYPE AND RH: 9500

## 2017-08-04 DIAGNOSIS — Z1389 Encounter for screening for other disorder: Secondary | ICD-10-CM | POA: Diagnosis not present

## 2017-08-10 DIAGNOSIS — Z3202 Encounter for pregnancy test, result negative: Secondary | ICD-10-CM | POA: Diagnosis not present

## 2017-08-10 DIAGNOSIS — Z30430 Encounter for insertion of intrauterine contraceptive device: Secondary | ICD-10-CM | POA: Diagnosis not present

## 2017-08-14 DIAGNOSIS — M5431 Sciatica, right side: Secondary | ICD-10-CM | POA: Diagnosis not present

## 2017-08-14 DIAGNOSIS — M9905 Segmental and somatic dysfunction of pelvic region: Secondary | ICD-10-CM | POA: Diagnosis not present

## 2017-08-14 DIAGNOSIS — M9904 Segmental and somatic dysfunction of sacral region: Secondary | ICD-10-CM | POA: Diagnosis not present

## 2017-08-14 DIAGNOSIS — M9903 Segmental and somatic dysfunction of lumbar region: Secondary | ICD-10-CM | POA: Diagnosis not present

## 2017-08-18 DIAGNOSIS — M9904 Segmental and somatic dysfunction of sacral region: Secondary | ICD-10-CM | POA: Diagnosis not present

## 2017-08-18 DIAGNOSIS — M9905 Segmental and somatic dysfunction of pelvic region: Secondary | ICD-10-CM | POA: Diagnosis not present

## 2017-08-18 DIAGNOSIS — M9903 Segmental and somatic dysfunction of lumbar region: Secondary | ICD-10-CM | POA: Diagnosis not present

## 2017-08-18 DIAGNOSIS — M5431 Sciatica, right side: Secondary | ICD-10-CM | POA: Diagnosis not present

## 2017-08-21 DIAGNOSIS — M9905 Segmental and somatic dysfunction of pelvic region: Secondary | ICD-10-CM | POA: Diagnosis not present

## 2017-08-21 DIAGNOSIS — M9904 Segmental and somatic dysfunction of sacral region: Secondary | ICD-10-CM | POA: Diagnosis not present

## 2017-08-21 DIAGNOSIS — M9903 Segmental and somatic dysfunction of lumbar region: Secondary | ICD-10-CM | POA: Diagnosis not present

## 2017-08-21 DIAGNOSIS — M5431 Sciatica, right side: Secondary | ICD-10-CM | POA: Diagnosis not present

## 2017-08-25 DIAGNOSIS — M9905 Segmental and somatic dysfunction of pelvic region: Secondary | ICD-10-CM | POA: Diagnosis not present

## 2017-08-25 DIAGNOSIS — M5431 Sciatica, right side: Secondary | ICD-10-CM | POA: Diagnosis not present

## 2017-08-25 DIAGNOSIS — M9904 Segmental and somatic dysfunction of sacral region: Secondary | ICD-10-CM | POA: Diagnosis not present

## 2017-08-25 DIAGNOSIS — M9903 Segmental and somatic dysfunction of lumbar region: Secondary | ICD-10-CM | POA: Diagnosis not present

## 2017-08-28 DIAGNOSIS — M9904 Segmental and somatic dysfunction of sacral region: Secondary | ICD-10-CM | POA: Diagnosis not present

## 2017-08-28 DIAGNOSIS — L700 Acne vulgaris: Secondary | ICD-10-CM | POA: Diagnosis not present

## 2017-08-28 DIAGNOSIS — M9903 Segmental and somatic dysfunction of lumbar region: Secondary | ICD-10-CM | POA: Diagnosis not present

## 2017-08-28 DIAGNOSIS — L858 Other specified epidermal thickening: Secondary | ICD-10-CM | POA: Diagnosis not present

## 2017-08-28 DIAGNOSIS — M9905 Segmental and somatic dysfunction of pelvic region: Secondary | ICD-10-CM | POA: Diagnosis not present

## 2017-08-28 DIAGNOSIS — M5431 Sciatica, right side: Secondary | ICD-10-CM | POA: Diagnosis not present

## 2017-09-02 DIAGNOSIS — M5431 Sciatica, right side: Secondary | ICD-10-CM | POA: Diagnosis not present

## 2017-09-02 DIAGNOSIS — M9905 Segmental and somatic dysfunction of pelvic region: Secondary | ICD-10-CM | POA: Diagnosis not present

## 2017-09-02 DIAGNOSIS — M9903 Segmental and somatic dysfunction of lumbar region: Secondary | ICD-10-CM | POA: Diagnosis not present

## 2017-09-02 DIAGNOSIS — M9904 Segmental and somatic dysfunction of sacral region: Secondary | ICD-10-CM | POA: Diagnosis not present

## 2017-09-07 DIAGNOSIS — M9903 Segmental and somatic dysfunction of lumbar region: Secondary | ICD-10-CM | POA: Diagnosis not present

## 2017-09-07 DIAGNOSIS — M5431 Sciatica, right side: Secondary | ICD-10-CM | POA: Diagnosis not present

## 2017-09-07 DIAGNOSIS — M9904 Segmental and somatic dysfunction of sacral region: Secondary | ICD-10-CM | POA: Diagnosis not present

## 2017-09-07 DIAGNOSIS — M9905 Segmental and somatic dysfunction of pelvic region: Secondary | ICD-10-CM | POA: Diagnosis not present

## 2017-09-11 DIAGNOSIS — M5431 Sciatica, right side: Secondary | ICD-10-CM | POA: Diagnosis not present

## 2017-09-11 DIAGNOSIS — M9903 Segmental and somatic dysfunction of lumbar region: Secondary | ICD-10-CM | POA: Diagnosis not present

## 2017-09-11 DIAGNOSIS — M9904 Segmental and somatic dysfunction of sacral region: Secondary | ICD-10-CM | POA: Diagnosis not present

## 2017-09-11 DIAGNOSIS — M9905 Segmental and somatic dysfunction of pelvic region: Secondary | ICD-10-CM | POA: Diagnosis not present

## 2017-09-21 DIAGNOSIS — M9904 Segmental and somatic dysfunction of sacral region: Secondary | ICD-10-CM | POA: Diagnosis not present

## 2017-09-21 DIAGNOSIS — M9905 Segmental and somatic dysfunction of pelvic region: Secondary | ICD-10-CM | POA: Diagnosis not present

## 2017-09-21 DIAGNOSIS — M5431 Sciatica, right side: Secondary | ICD-10-CM | POA: Diagnosis not present

## 2017-09-21 DIAGNOSIS — M9903 Segmental and somatic dysfunction of lumbar region: Secondary | ICD-10-CM | POA: Diagnosis not present

## 2017-09-22 DIAGNOSIS — Z30431 Encounter for routine checking of intrauterine contraceptive device: Secondary | ICD-10-CM | POA: Diagnosis not present

## 2017-09-24 DIAGNOSIS — M9905 Segmental and somatic dysfunction of pelvic region: Secondary | ICD-10-CM | POA: Diagnosis not present

## 2017-09-24 DIAGNOSIS — M9904 Segmental and somatic dysfunction of sacral region: Secondary | ICD-10-CM | POA: Diagnosis not present

## 2017-09-24 DIAGNOSIS — M5431 Sciatica, right side: Secondary | ICD-10-CM | POA: Diagnosis not present

## 2017-09-24 DIAGNOSIS — M9903 Segmental and somatic dysfunction of lumbar region: Secondary | ICD-10-CM | POA: Diagnosis not present

## 2017-09-30 DIAGNOSIS — M9905 Segmental and somatic dysfunction of pelvic region: Secondary | ICD-10-CM | POA: Diagnosis not present

## 2017-09-30 DIAGNOSIS — M9904 Segmental and somatic dysfunction of sacral region: Secondary | ICD-10-CM | POA: Diagnosis not present

## 2017-09-30 DIAGNOSIS — M5431 Sciatica, right side: Secondary | ICD-10-CM | POA: Diagnosis not present

## 2017-09-30 DIAGNOSIS — M9903 Segmental and somatic dysfunction of lumbar region: Secondary | ICD-10-CM | POA: Diagnosis not present

## 2017-10-07 DIAGNOSIS — M5431 Sciatica, right side: Secondary | ICD-10-CM | POA: Diagnosis not present

## 2017-10-07 DIAGNOSIS — M9904 Segmental and somatic dysfunction of sacral region: Secondary | ICD-10-CM | POA: Diagnosis not present

## 2017-10-07 DIAGNOSIS — M9905 Segmental and somatic dysfunction of pelvic region: Secondary | ICD-10-CM | POA: Diagnosis not present

## 2017-10-07 DIAGNOSIS — M9903 Segmental and somatic dysfunction of lumbar region: Secondary | ICD-10-CM | POA: Diagnosis not present

## 2017-10-12 DIAGNOSIS — M9904 Segmental and somatic dysfunction of sacral region: Secondary | ICD-10-CM | POA: Diagnosis not present

## 2017-10-12 DIAGNOSIS — M9905 Segmental and somatic dysfunction of pelvic region: Secondary | ICD-10-CM | POA: Diagnosis not present

## 2017-10-12 DIAGNOSIS — M9903 Segmental and somatic dysfunction of lumbar region: Secondary | ICD-10-CM | POA: Diagnosis not present

## 2017-10-12 DIAGNOSIS — M5431 Sciatica, right side: Secondary | ICD-10-CM | POA: Diagnosis not present

## 2017-10-21 DIAGNOSIS — M9905 Segmental and somatic dysfunction of pelvic region: Secondary | ICD-10-CM | POA: Diagnosis not present

## 2017-10-21 DIAGNOSIS — M9904 Segmental and somatic dysfunction of sacral region: Secondary | ICD-10-CM | POA: Diagnosis not present

## 2017-10-21 DIAGNOSIS — M5431 Sciatica, right side: Secondary | ICD-10-CM | POA: Diagnosis not present

## 2017-10-21 DIAGNOSIS — M9903 Segmental and somatic dysfunction of lumbar region: Secondary | ICD-10-CM | POA: Diagnosis not present

## 2018-01-26 IMAGING — US US MFM OB FOLLOW-UP
1 series · 13 of 28 positions shown · non-contrast
Comparison: none

[Series 1: us mfm ob follow-up · 67 acquisitions, 13 frames shown]
[im 3/67]
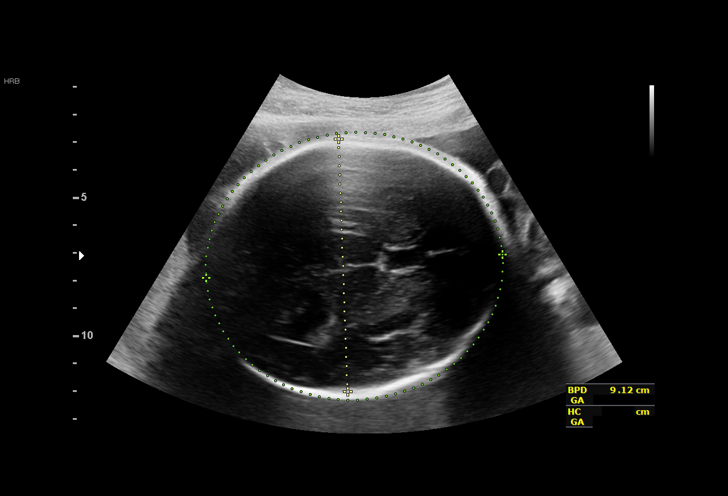
[im 8/67]
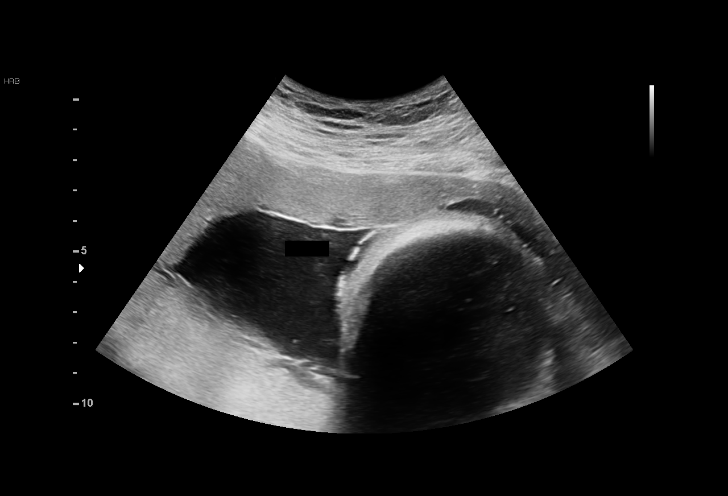
[im 13/67]
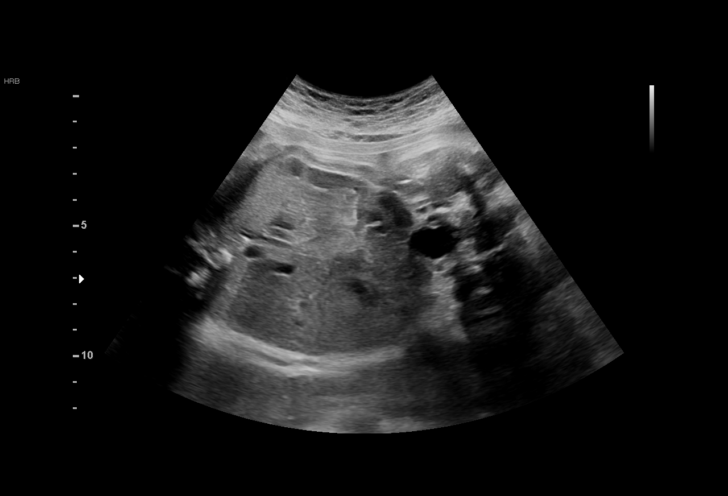
[im 18/67]
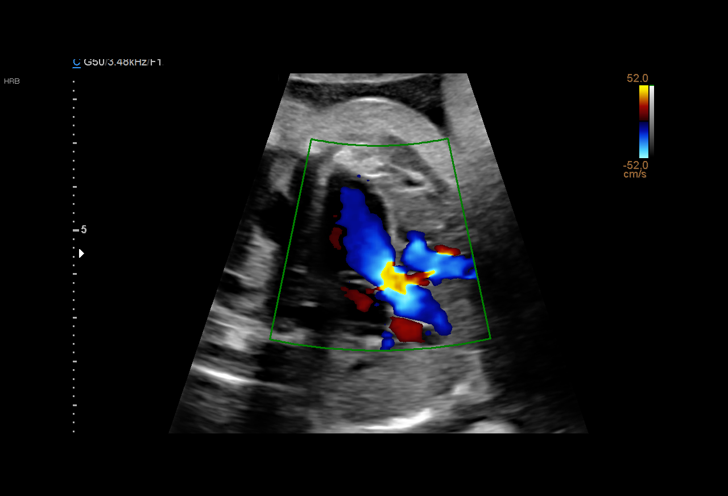
[im 23/67]
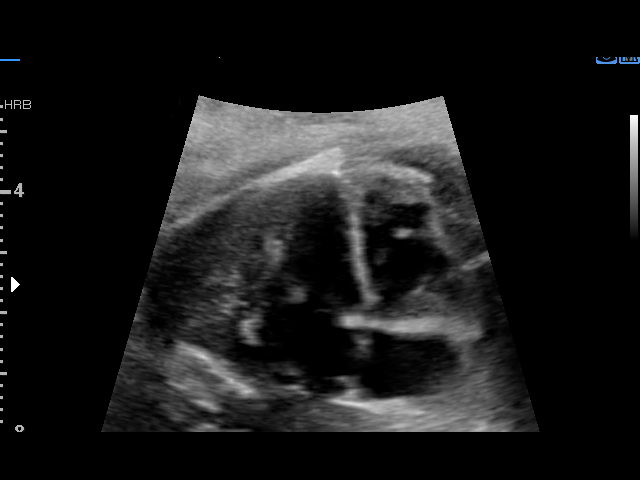
[im 27/67]
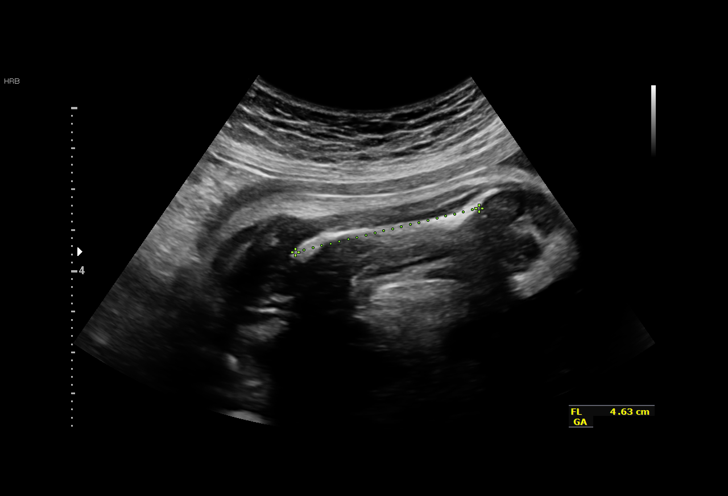
[im 35/67]
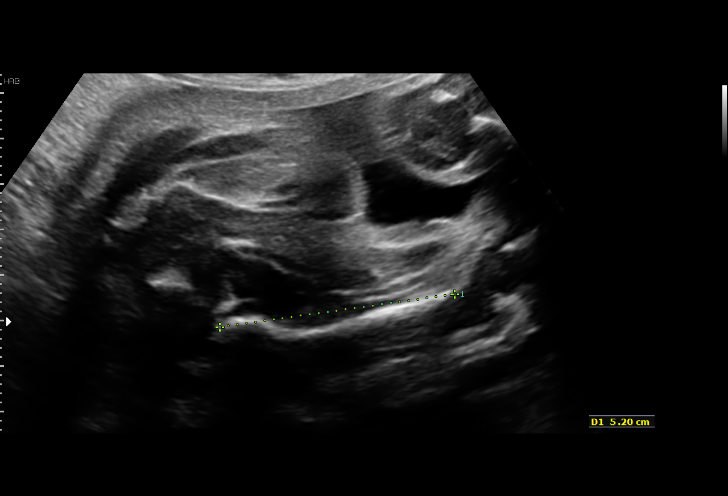
[im 40/67]
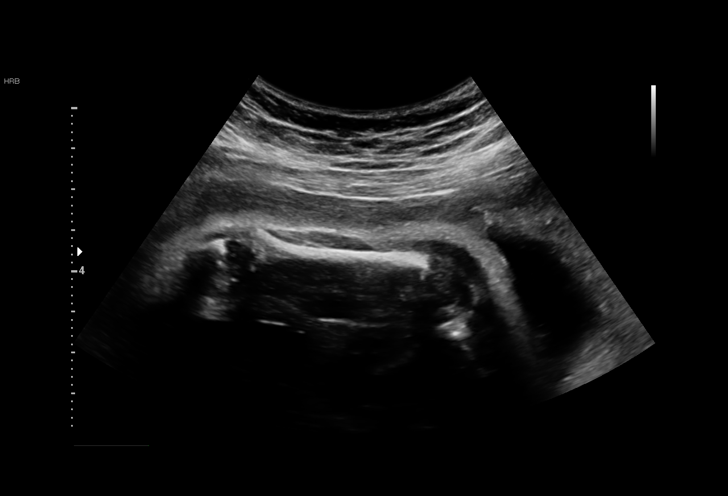
[im 45/67]
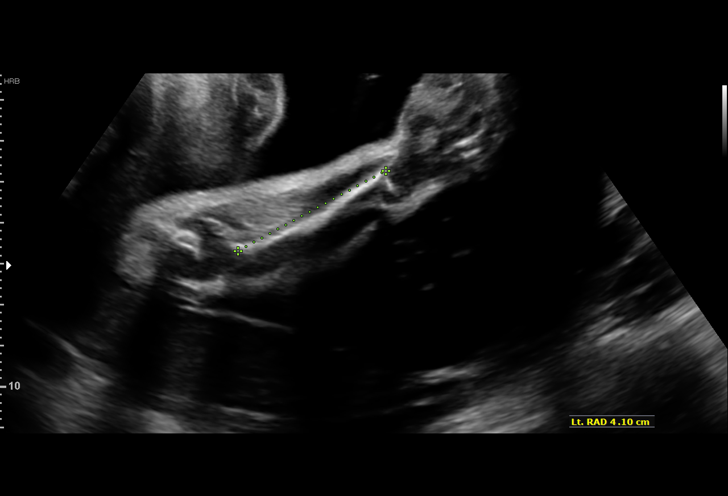
[im 49/67]
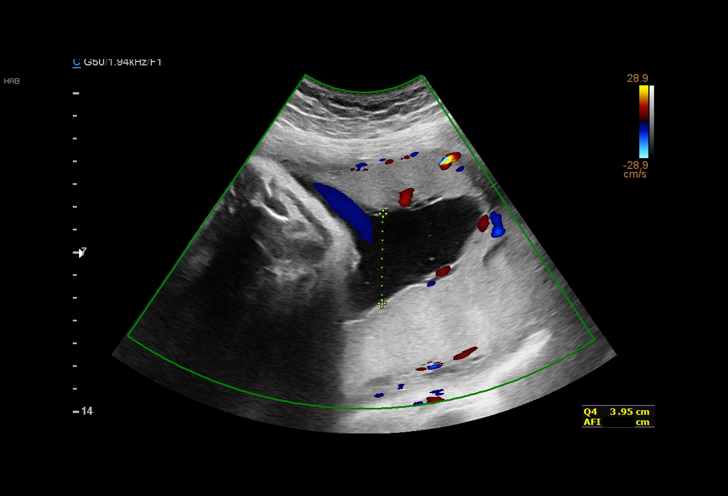
[im 54/67]
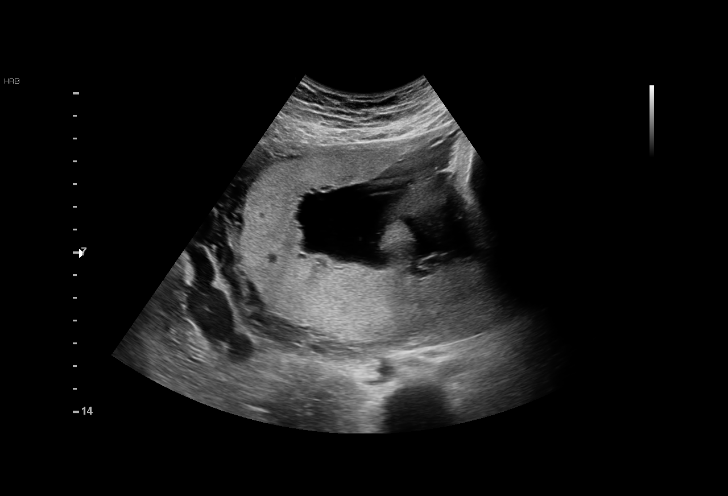
[im 59/67]
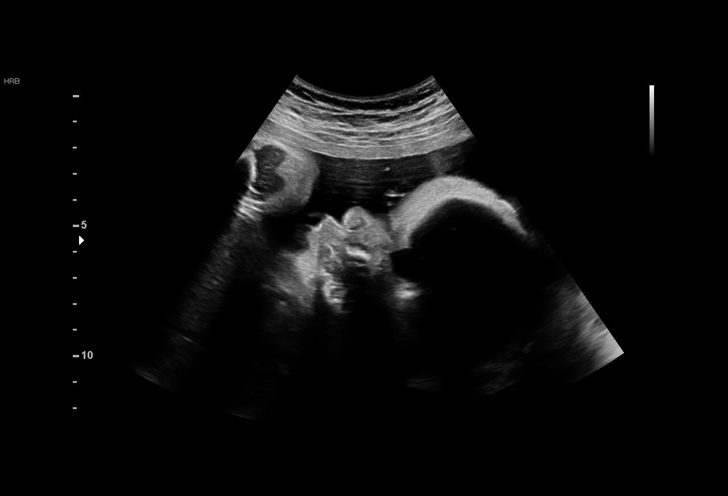
[im 64/67]
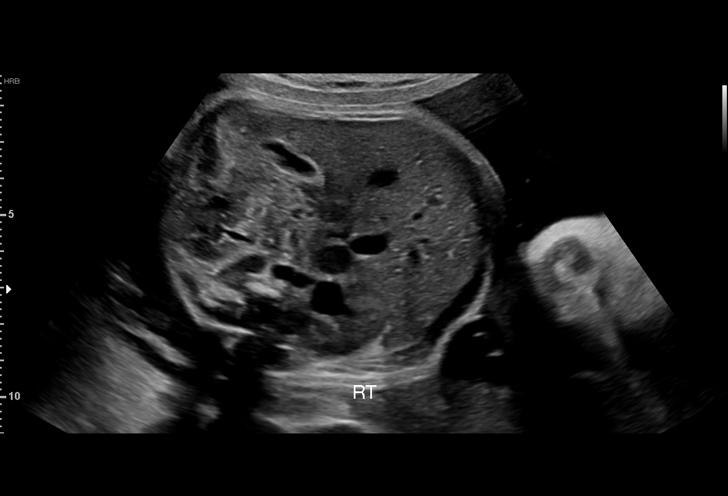

[13 of 28 positions shown; findings below may reference images not displayed]

1  HAFSA KANTER              [id number]      [id number]     [id number]
Indications

31 weeks gestation of pregnancy
Encounter for other antenatal screening
follow-up
Fetal abnormality - other known or
suspected; short long bone; low risk NIPS
Placenta previa specified as without
hemorrhage, third trimester
OB History

Gravidity:    2         Term:   1        Prem:   0        SAB:   0
TOP:          0       Ectopic:  0        Living: 1
Fetal Evaluation

Num Of Fetuses:     1
Fetal Heart         140
Rate(bpm):
Cardiac Activity:   Observed
Presentation:       Cephalic
Placenta:           Posterior Previa
P. Cord Insertion:  Previously Visualized

Amniotic Fluid
AFI FV:      Subjectively within normal limits

AFI Sum(cm)     %Tile       Largest Pocket(cm)
18.29           68

RUQ(cm)       RLQ(cm)       LUQ(cm)        LLQ(cm)
3.05
Biometry

BPD:      90.3  mm     G. Age:  36w 4d       > 99  %    CI:        79.41   %    70 - 86
FL/HC:      14.6   %    19.3 -
HC:      320.3  mm     G. Age:  36w 1d       > 97  %    HC/AC:      1.06        0.96 -
AC:       302   mm     G. Age:  34w 1d       > 97  %    FL/BPD:     51.8   %    71 - 87
FL:       46.8  mm     G. Age:  25w 4d        < 3  %    FL/AC:      15.5   %    20 - 24
HUM:      43.2  mm     G. Age:  25w 5d        < 5  %

RIGHT
HUM:      43.2  mm     G. Age:  25w 5d        < 5  %
FL:         52  mm     G. Age:  27w 5d        < 3  %
ULN:        41  mm     G. Age:  26w 5d        < 5  %
TIB:        45  mm     G. Age:  27w 4d        < 5  %
RAD:        41  mm     G. Age:  28w 4d         34  %
FIB:      45.8  mm     G. Age:  28w 3d         35  %
LEFT
HUM:      43.2  mm     G. Age:  25w 5d        < 5  %
FL:       47.1  mm     G. Age:  25w 5d        < 3  %
ULN:      44.8  mm     G. Age:  28w 5d        < 5  %
TIB:      44.4  mm     G. Age:  27w 2d        < 5  %
RAD:      44.8  mm     G. Age:  31w 6d         53  %
FIB:      42.9  mm     G. Age:  26w 3d         20  %

Est. FW:    7253  gm      4 lb 3 oz     72  %
Gestational Age

LMP:           31w 2d        Date:  10/08/16                 EDD:   07/15/17
U/S Today:     33w 1d                                        EDD:   07/02/17
Best:          31w 2d     Det. By:  LMP  (10/08/16)          EDD:   07/15/17
Anatomy

Cranium:               Appears normal         Aortic Arch:            Previously seen
Cavum:                 Appears normal         Ductal Arch:            Previously seen
Ventricles:            Appears normal         Diaphragm:              Previously seen
Choroid Plexus:        Previously seen        Stomach:                Appears normal, left
sided
Cerebellum:            Previously seen        Abdomen:                Appears normal
Posterior Fossa:       Previously seen        Abdominal Wall:         Previously seen
Nuchal Fold:           Not applicable (>20    Cord Vessels:           Previously seen
wks GA)
Face:                  Abnormal: frontal      Kidneys:                Right sided
bossing                                        pyelectasis,
10.6mm
Lips:                  Previously seen        Bladder:                Appears normal
Thoracic:              Appears normal         Spine:                  Previously seen
Heart:                 Appears normal         Upper Extremities:      Abnormal, see
(4CH, axis, and
situs)
comments

RVOT:                  Appears normal         Lower Extremities:      Abnormal, see
comments
LVOT:                  Appears normal

Other:  Fetus appears to be a male. Heels visualized. Nasal bone visualized.
Technically difficult due to fetal position.
Cervix Uterus Adnexa
Cervix
Not visualized (advanced GA >64wks)
Impression

SIUP at 31+2 weeks
Findings most c/w achondroplasia; short long bones, trident
hands, frontal bossing, depressed nasal bridge, large head
size, normal 18 week US
All other interval fetal anatomy was seen and appeared
normal except for UTD A1 on right; anatomic survey complete
Normal amniotic fluid volume
Appropriate interval growth with EFW at the 72nd %tile; head
size > 97th %tile; AC > 97th %tile
Asymmetric, posterior, complete placenta previa
Recommendations

Repeat C/S is planned for 36 weeks

## 2018-08-06 ENCOUNTER — Ambulatory Visit
Admission: RE | Admit: 2018-08-06 | Discharge: 2018-08-06 | Disposition: A | Discharge status: Home / Self Care | Payer: BLUE CROSS/BLUE SHIELD | Source: Ambulatory Visit | Attending: Family Medicine | Admitting: Family Medicine

## 2018-08-06 ENCOUNTER — Other Ambulatory Visit: Payer: Self-pay | Admitting: Family Medicine

## 2018-08-06 DIAGNOSIS — R609 Edema, unspecified: Secondary | ICD-10-CM

## 2018-08-06 DIAGNOSIS — M79641 Pain in right hand: Secondary | ICD-10-CM | POA: Diagnosis not present

## 2018-08-06 DIAGNOSIS — S6991XA Unspecified injury of right wrist, hand and finger(s), initial encounter: Secondary | ICD-10-CM | POA: Diagnosis not present

## 2018-08-24 DIAGNOSIS — Z01419 Encounter for gynecological examination (general) (routine) without abnormal findings: Secondary | ICD-10-CM | POA: Diagnosis not present

## 2018-08-24 DIAGNOSIS — Z6833 Body mass index (BMI) 33.0-33.9, adult: Secondary | ICD-10-CM | POA: Diagnosis not present

## 2018-08-24 DIAGNOSIS — F419 Anxiety disorder, unspecified: Secondary | ICD-10-CM | POA: Diagnosis not present

## 2019-04-13 ENCOUNTER — Encounter: Payer: Self-pay | Admitting: Family Medicine

## 2019-04-13 NOTE — Progress Notes (Signed)
ECG from outside facility

## 2019-04-19 IMAGING — CR DG HAND COMPLETE 3+V*R*
3 series · 3 of 3 positions shown · non-contrast
Comparison: None.

CLINICAL DATA: Pain following recent fall

EXAM:
RIGHT HAND - COMPLETE 3+ VIEW

[x hand pa right]
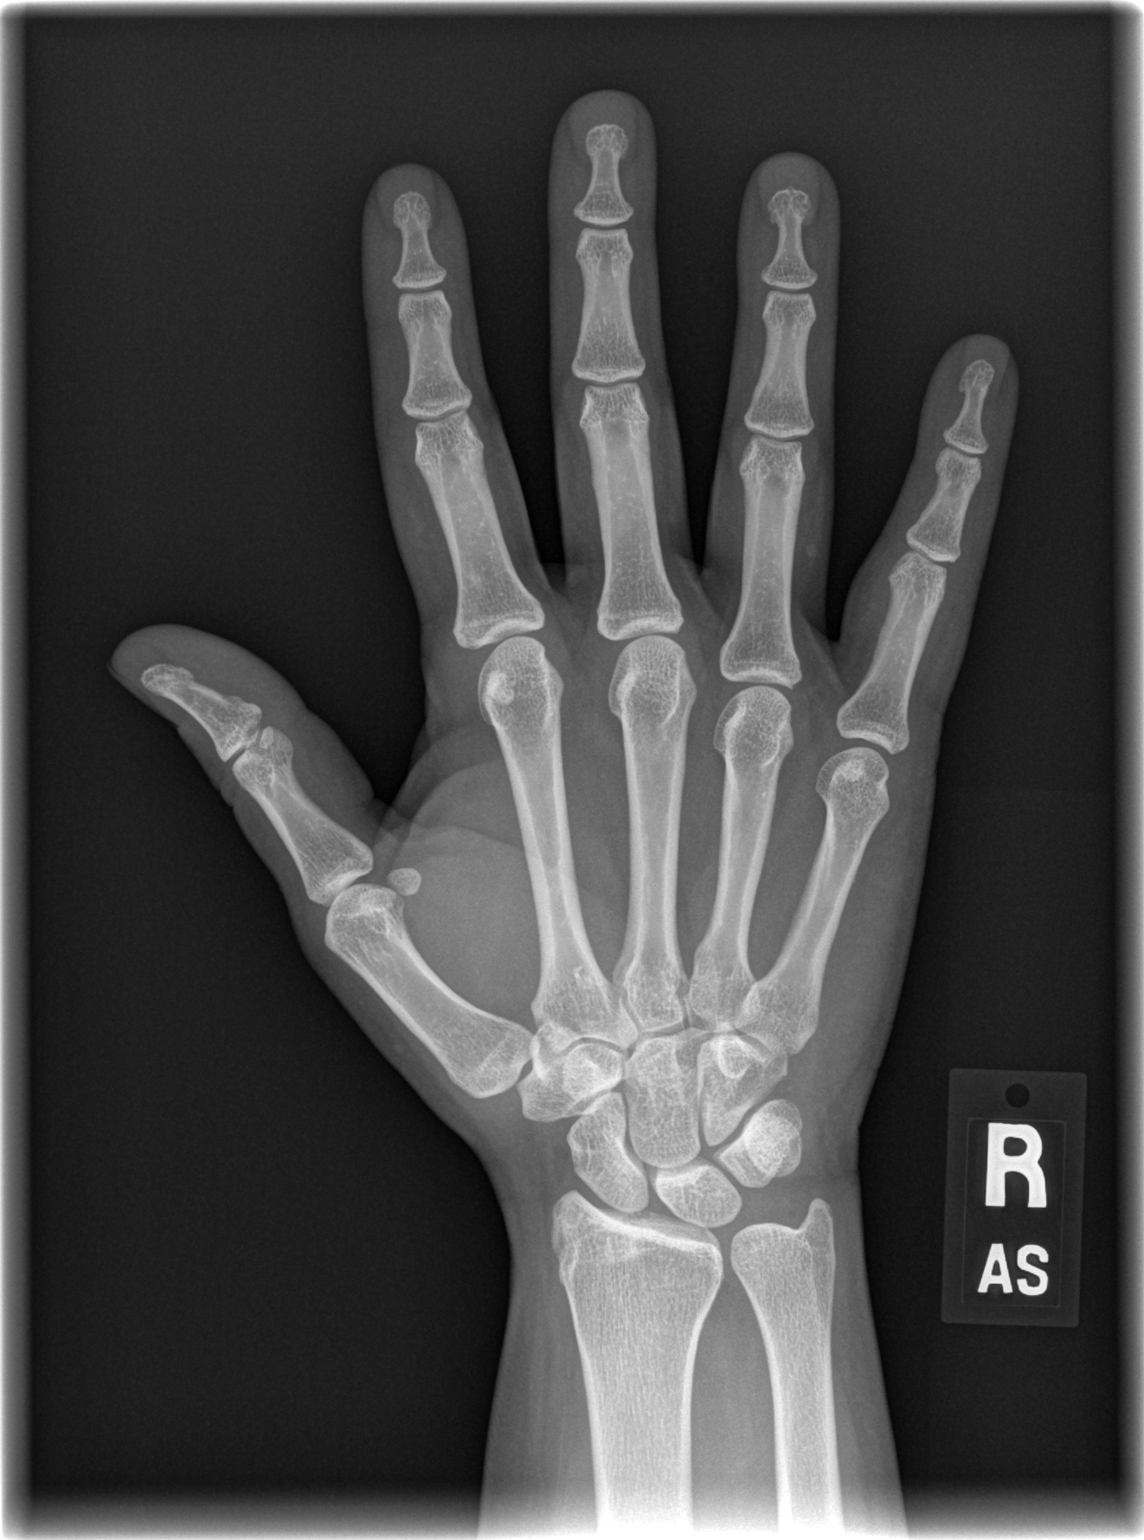

[x hand oblique right]
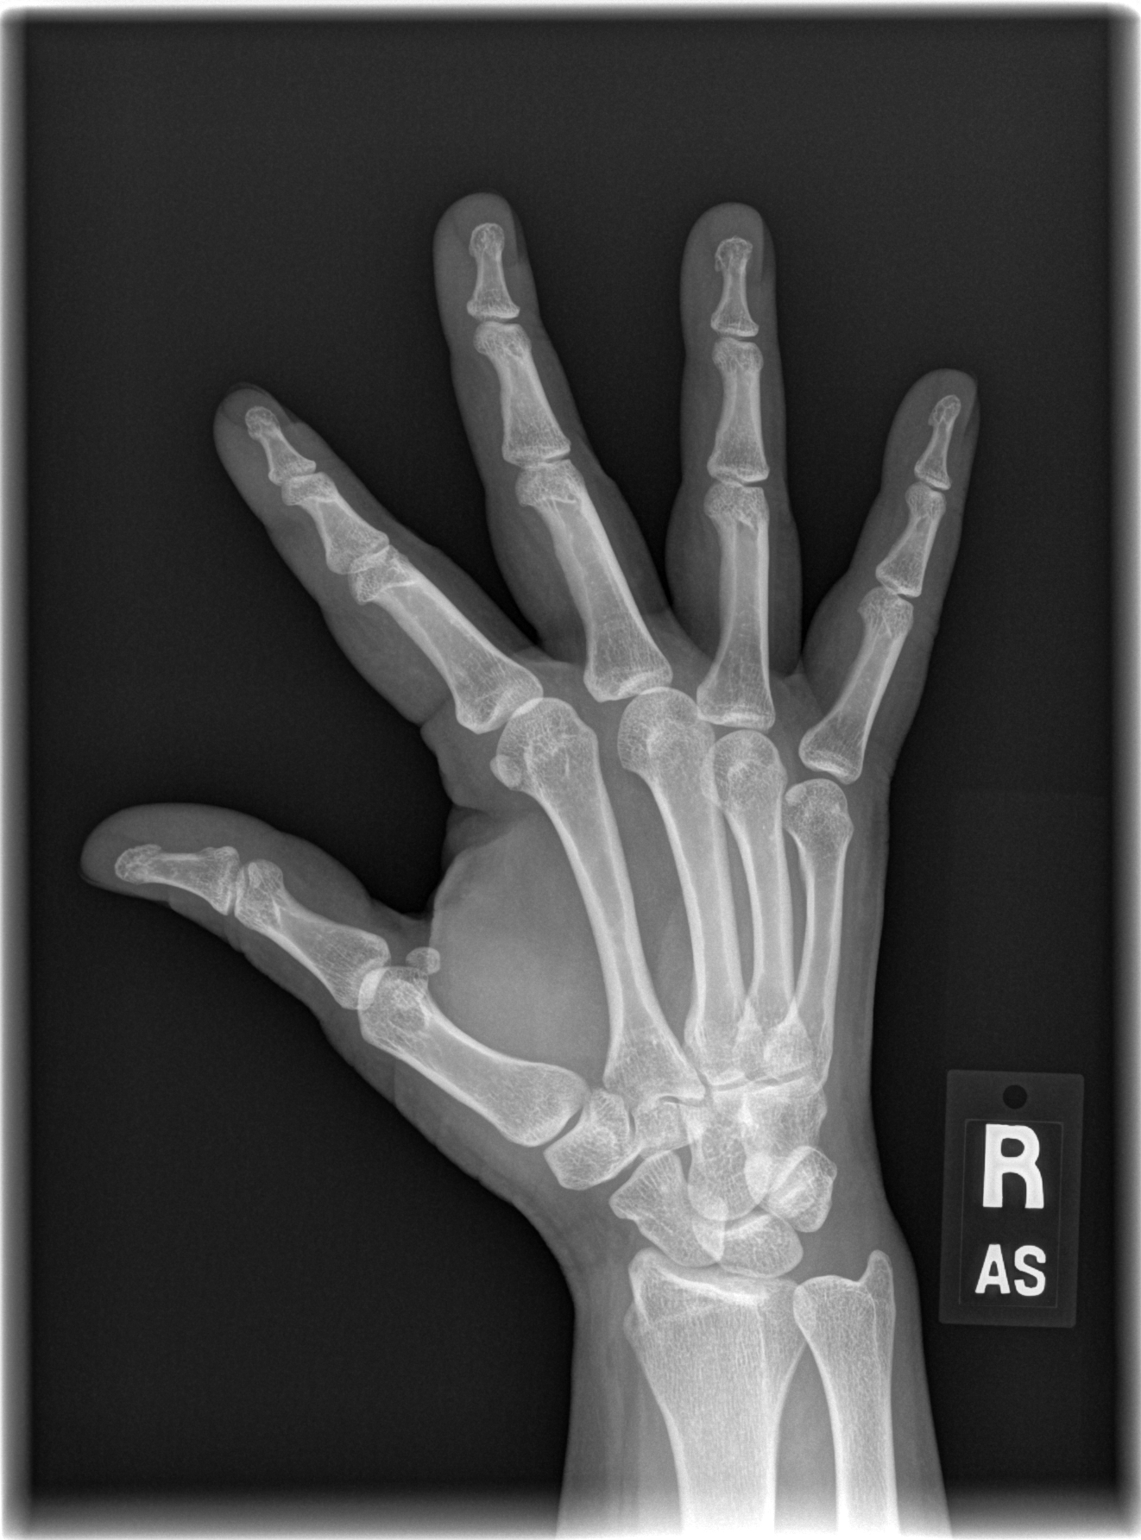

[x hand lat right]
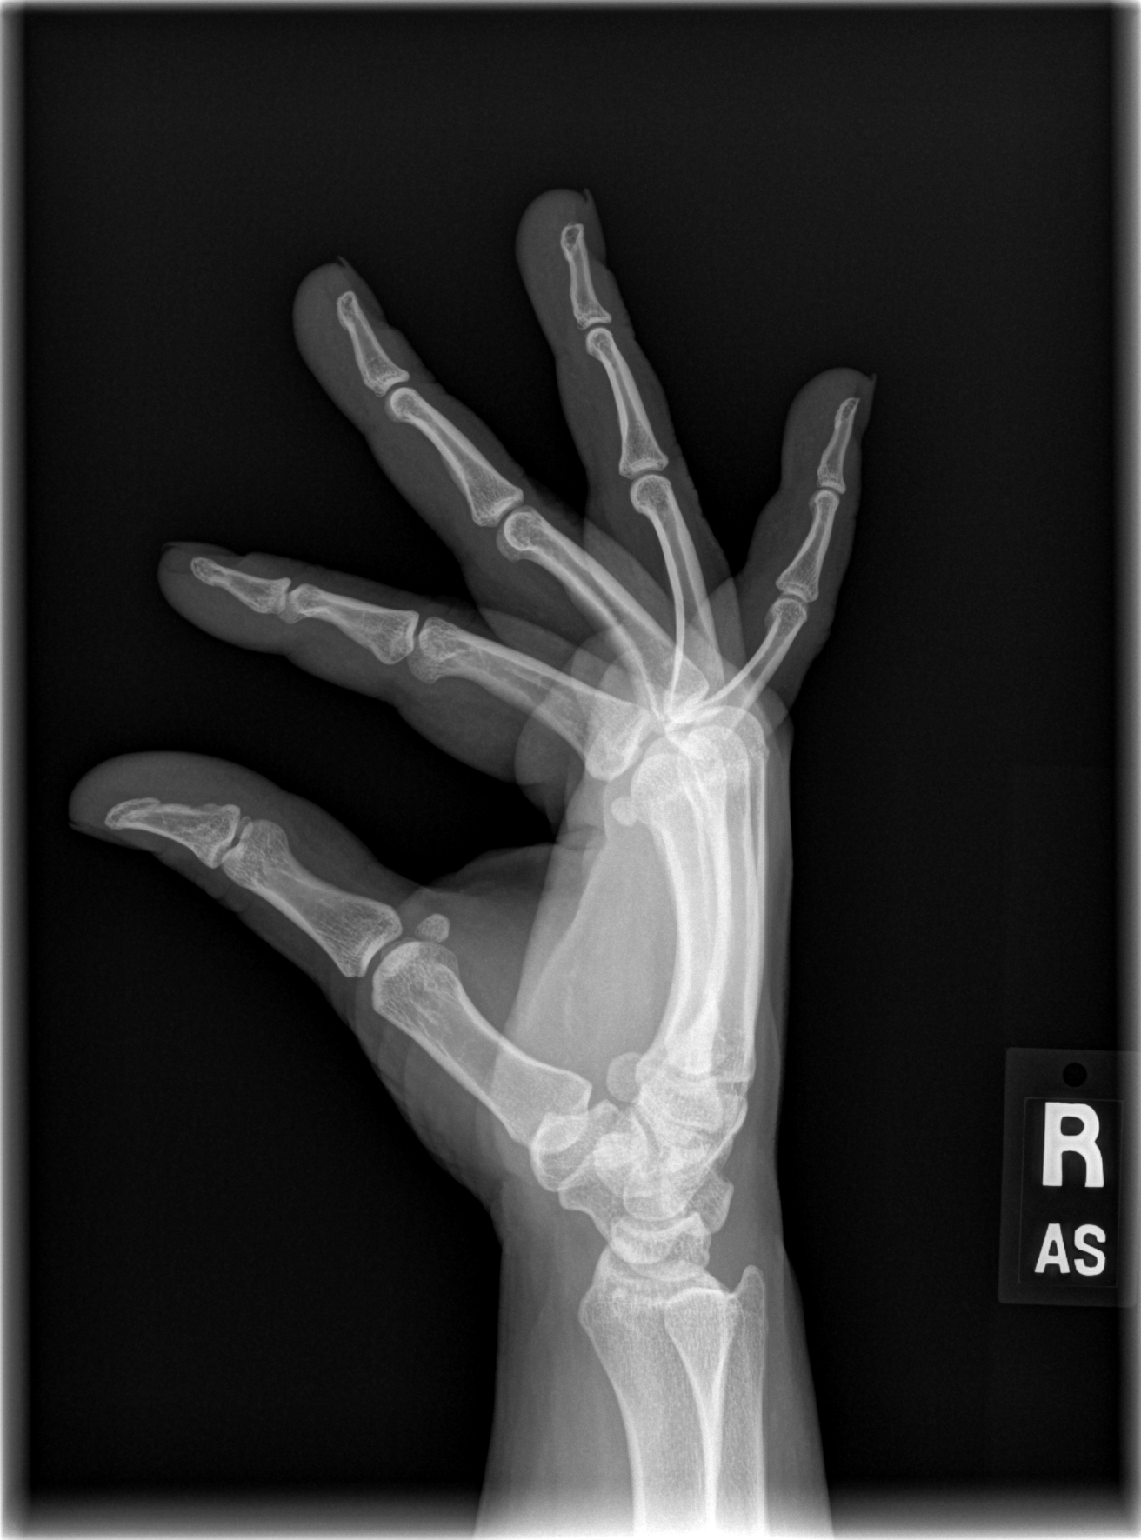

[3 of 3 positions shown; findings below may reference images not displayed]

FINDINGS: Frontal, oblique, and lateral views were obtained. No evident
fracture or dislocation. Joint spaces appear normal. No erosive
change.
IMPRESSION: No fracture or dislocation.  No evident arthropathy.

## 2019-04-21 ENCOUNTER — Other Ambulatory Visit: Payer: Self-pay

## 2019-04-21 DIAGNOSIS — Z20822 Contact with and (suspected) exposure to covid-19: Secondary | ICD-10-CM

## 2019-04-23 LAB — NOVEL CORONAVIRUS, NAA: SARS-CoV-2, NAA: NOT DETECTED
